# Patient Record
Sex: Male | Born: 1990 | Race: White | Hispanic: No | Marital: Single | Smoking: Never smoker
Health system: Southern US, Community
[De-identification: ages and names within clinical notes are randomized; demographics above are authoritative.]

## PROBLEM LIST (undated history)

## (undated) ENCOUNTER — Encounter: Attending: Family | Primary: Family

## (undated) ENCOUNTER — Telehealth

## (undated) ENCOUNTER — Encounter

## (undated) ENCOUNTER — Ambulatory Visit

## (undated) ENCOUNTER — Encounter: Attending: Infectious Disease | Primary: Infectious Disease

## (undated) ENCOUNTER — Encounter
Attending: Rehabilitative and Restorative Service Providers" | Primary: Rehabilitative and Restorative Service Providers"

## (undated) ENCOUNTER — Encounter
Attending: Student in an Organized Health Care Education/Training Program | Primary: Student in an Organized Health Care Education/Training Program

## (undated) ENCOUNTER — Ambulatory Visit
Payer: BLUE CROSS/BLUE SHIELD | Attending: Student in an Organized Health Care Education/Training Program | Primary: Student in an Organized Health Care Education/Training Program

## (undated) ENCOUNTER — Telehealth: Attending: Family | Primary: Family

## (undated) ENCOUNTER — Ambulatory Visit: Payer: BLUE CROSS/BLUE SHIELD

## (undated) ENCOUNTER — Ambulatory Visit: Payer: PRIVATE HEALTH INSURANCE

## (undated) ENCOUNTER — Encounter: Payer: PRIVATE HEALTH INSURANCE | Attending: Family | Primary: Family

## (undated) ENCOUNTER — Ambulatory Visit: Payer: PRIVATE HEALTH INSURANCE | Attending: Family | Primary: Family

## (undated) ENCOUNTER — Ambulatory Visit: Attending: Pharmacist | Primary: Pharmacist

## (undated) ENCOUNTER — Telehealth
Attending: Student in an Organized Health Care Education/Training Program | Primary: Student in an Organized Health Care Education/Training Program

## (undated) ENCOUNTER — Encounter: Attending: Physical Medicine & Rehabilitation | Primary: Physical Medicine & Rehabilitation

## (undated) ENCOUNTER — Ambulatory Visit: Payer: PRIVATE HEALTH INSURANCE | Attending: Rheumatology | Primary: Rheumatology

## (undated) ENCOUNTER — Telehealth: Attending: Rheumatology | Primary: Rheumatology

## (undated) ENCOUNTER — Encounter: Attending: Ambulatory Care | Primary: Ambulatory Care

## (undated) ENCOUNTER — Encounter: Attending: Pharmacist | Primary: Pharmacist

## (undated) ENCOUNTER — Ambulatory Visit
Payer: PRIVATE HEALTH INSURANCE | Attending: Student in an Organized Health Care Education/Training Program | Primary: Student in an Organized Health Care Education/Training Program

## (undated) ENCOUNTER — Ambulatory Visit: Payer: BLUE CROSS/BLUE SHIELD | Attending: Hematology & Oncology | Primary: Hematology & Oncology

## (undated) ENCOUNTER — Encounter: Attending: Rheumatology | Primary: Rheumatology

## (undated) ENCOUNTER — Encounter: Attending: Hematology & Oncology | Primary: Hematology & Oncology

## (undated) ENCOUNTER — Telehealth: Attending: Ambulatory Care | Primary: Ambulatory Care

## (undated) DIAGNOSIS — M069 Rheumatoid arthritis, unspecified: Secondary | ICD-10-CM

## (undated) DIAGNOSIS — Z9109 Other allergy status, other than to drugs and biological substances: Secondary | ICD-10-CM

## (undated) HISTORY — PX: WISDOM TOOTH EXTRACTION: SHX21

## (undated) HISTORY — PX: MANDIBLE SURGERY: SHX707

## (undated) HISTORY — PX: TYMPANOSTOMY TUBE PLACEMENT: SHX32

## (undated) HISTORY — PX: KNEE SURGERY: SHX244

---
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
## 2008-03-27 ENCOUNTER — Inpatient Hospital Stay (HOSPITAL_COMMUNITY): Admission: AD | Admit: 2008-03-27 | Discharge: 2008-04-03 | Payer: Self-pay | Admitting: Orthopedic Surgery

## 2008-03-28 ENCOUNTER — Ambulatory Visit: Payer: Self-pay | Admitting: Infectious Diseases

## 2008-04-02 ENCOUNTER — Encounter (INDEPENDENT_AMBULATORY_CARE_PROVIDER_SITE_OTHER): Payer: Self-pay | Admitting: Orthopedic Surgery

## 2010-03-01 ENCOUNTER — Encounter: Payer: Self-pay | Admitting: Family Medicine

## 2010-05-26 LAB — BASIC METABOLIC PANEL
CO2: 28 mEq/L (ref 19–32)
CO2: 30 mEq/L (ref 19–32)
CO2: 32 mEq/L (ref 19–32)
Calcium: 9 mg/dL (ref 8.4–10.5)
Calcium: 9.3 mg/dL (ref 8.4–10.5)
Calcium: 9.7 mg/dL (ref 8.4–10.5)
Chloride: 98 mEq/L (ref 96–112)
Creatinine, Ser: 0.82 mg/dL (ref 0.4–1.5)
Creatinine, Ser: 0.82 mg/dL (ref 0.4–1.5)
Creatinine, Ser: 0.94 mg/dL (ref 0.4–1.5)
GFR calc Af Amer: >60 mL/min (ref >60)
GFR calc non Af Amer: >60 mL/min (ref >60)
Glucose, Bld: 118 mg/dL — ABNORMAL HIGH (ref 70–99)
Glucose, Bld: 123 mg/dL — ABNORMAL HIGH (ref 70–99)
Glucose, Bld: 136 mg/dL — ABNORMAL HIGH (ref 70–99)
Sodium: 134 mEq/L — ABNORMAL LOW (ref 135–145)
Sodium: 136 mEq/L (ref 135–145)

## 2010-05-26 LAB — AFB CULTURE WITH SMEAR (NOT AT ARMC): Acid Fast Smear: NONE SEEN

## 2010-05-26 LAB — ANAEROBIC CULTURE

## 2010-05-26 LAB — DIFFERENTIAL
Basophils Absolute: 0.1 10*3/uL (ref 0.0–0.1)
Basophils Relative: 1 % (ref 0–1)
Basophils Relative: 1 % (ref 0–1)
Eosinophils Absolute: 0.1 10*3/uL (ref 0.0–0.7)
Eosinophils Relative: 1 % (ref 0–5)
Lymphocytes Relative: 14 % (ref 12–46)
Lymphocytes Relative: 17 % (ref 12–46)
Lymphs Abs: 1 10*3/uL (ref 0.7–4.0)
Monocytes Absolute: 0.7 10*3/uL (ref 0.1–1.0)
Monocytes Absolute: 0.9 10*3/uL (ref 0.1–1.0)
Monocytes Relative: 10 % (ref 3–12)
Monocytes Relative: 12 % (ref 3–12)
Neutro Abs: 3.7 10*3/uL (ref 1.7–7.7)
Neutro Abs: 5.3 10*3/uL (ref 1.7–7.7)
Neutrophils Relative %: 67 % (ref 43–77)
Neutrophils Relative %: 70 % (ref 43–77)
Neutrophils Relative %: 72 % (ref 43–77)

## 2010-05-26 LAB — VANCOMYCIN, TROUGH: Vancomycin Tr: 11.7 ug/mL (ref 10.0–20.0)

## 2010-05-26 LAB — SYNOVIAL CELL COUNT + DIFF, W/ CRYSTALS
Eosinophils-Synovial: 0 % (ref 0–1)
Lymphocytes-Synovial Fld: 4 % (ref 0–20)
Monocyte-Macrophage-Synovial Fluid: 1 % — ABNORMAL LOW (ref 50–90)
Neutrophil, Synovial: 95 % — ABNORMAL HIGH (ref 0–25)
Other Cells-SYN: 0

## 2010-05-26 LAB — CBC
Hemoglobin: 12.2 g/dL — ABNORMAL LOW (ref 13.0–17.0)
Hemoglobin: 12.3 g/dL — ABNORMAL LOW (ref 13.0–17.0)
Hemoglobin: 13.8 g/dL (ref 13.0–17.0)
MCHC: 35 g/dL (ref 30.0–36.0)
MCHC: 35.2 g/dL (ref 30.0–36.0)
MCHC: 35.2 g/dL (ref 30.0–36.0)
MCV: 83.9 fL (ref 78.0–100.0)
Platelets: 341 10*3/uL (ref 150–400)
RBC: 4.09 MIL/uL — ABNORMAL LOW (ref 4.22–5.81)
RDW: 12.2 % (ref 11.5–15.5)
RDW: 12.4 % (ref 11.5–15.5)
WBC: 7.4 10*3/uL (ref 4.0–10.5)

## 2010-05-26 LAB — FUNGUS CULTURE W SMEAR
Fungal Smear: NONE SEEN
Fungal Smear: NONE SEEN

## 2010-05-26 LAB — ANA: Anti Nuclear Antibody(ANA): NEGATIVE

## 2010-05-26 LAB — BODY FLUID CULTURE
Culture: NO GROWTH
Culture: NO GROWTH

## 2010-05-26 LAB — TISSUE CULTURE: Culture: NO GROWTH

## 2010-05-26 LAB — CYCLIC CITRUL PEPTIDE ANTIBODY, IGG: Cyclic Citrullin Peptide Ab: 0.6 U/mL (ref <7)

## 2010-05-26 LAB — SEDIMENTATION RATE
Sed Rate: 111 mm/hr — ABNORMAL HIGH (ref 0–16)
Sed Rate: 92 mm/hr — ABNORMAL HIGH (ref 0–16)

## 2010-05-26 LAB — GLUCOSE, CAPILLARY

## 2010-06-23 NOTE — Op Note (Signed)
NAME:  Charles Schroeder, Charles Schroeder                  ACCOUNT NO.:  [id number]   MEDICAL RECORD NO.:  [id number]          PATIENT TYPE:  INP   LOCATION:  5025                         FACILITY:  [facility]   PHYSICIAN:  Harvie Junior, M.D.   DATE OF BIRTH:  1990-11-20   DATE OF PROCEDURE:  03/29/2008  DATE OF DISCHARGE:                               OPERATIVE REPORT   PREOPERATIVE DIAGNOSIS:  Severely infected right knee status post  previous arthroscopic incision and drainage in [place], [place].   PROCEDURES:  Open arthrotomy with debridement of multiple contaminated  tissues and thorough lavage and irrigation of the knee joint.   SURGEON:  Harvie Junior, M.D.   ASSISTANT:  Marshia Ly, P.A.   ANESTHESIA:  General.   BRIEF HISTORY:  [name] is a 20 year old male with a long history of  having a fall on a tennis court while he was down at the [facility].  He suffered an abrasion on the anterior aspect of his knee and  unfortunately, over the next several days developed increasing pain and  swelling in the knee.  He was aspirated in the clinic down in [place],  where they got 25,000 white cells.  They were ultimately concerned about  the possibility of an infection.  He was ultimately taken to the  operating room for irrigation and debridement of the knee.  This was  done in arthroscopic fashion.  No drains were placed.  He was given 2  days of IV antibiotics and sent out on orals.  He was brought home  because his family was concerned about him and after about 4-5 days, his  knee began to swell again.  He was seen by Dr. Renae Fickle in our office who  aspirated 120 cc of fluid from the knee and admitted to the hospital for  antibiotics and ID was consulted.  Evaluation at that time showed that  he was not in extreme pain, but he did have a swollen knee but it is not  that warm and is certainly not red.  We awaited results of the  aspiration which shows a 73,000 white cells and we waited for  Infectious  Disease to see the patient because there was still concern about the  possibility of some other diagnosis given that he had not responded well  to the arthroscopic I and D.  We did do an MRI examination which showed  that he had what looked to be some synovitic-type tissue and some  loculated areas and ultimately at this point with the help of the  Infectious Disease, we felt that he needed formal I and D, and he was  brought to the operating room for the procedure.   PROCEDURE:  The patient was brought to the operating room.  After  adequate anesthesia was obtained with general anesthetic, the patient  was placed supine on the operating table.  The right leg was prepped and  draped in usual sterile fashion.  Following this, a small incision was  made over the midline of the knee.  A subcutaneous tissue was dissected  down to the level of the extensor mechanism and a medial aspect of the  quad tendon was divided medial third.  Once this was done, dramatic  amounts of pus was drained from the knee probably a couple of 100 mL of  pus came out of the knee.  At this point, there was a dramatic amount of  granulation and synovitic-type tissue.  We basically went medially and  did a synovectomy.  We went laterally and did a synovectomy.  We did  centrally and did a synovectomy.  We then took the pulsatile lavage  irrigator and instilled 6 liters of fluid through the knee to help wash  out any bacteria or products of inflammation.  Once this was completed,  bug juice was instilled in the knee, 1 liter of bug juice and when this  was completed, this was irrigated out with a handheld irrigation.  At  this point, 2 large-bore Hemovac drains were placed, one medial and one  lateral and then the arthrotomy was closed with  1 Vicryl running and the skin with 3-0 nylon stitches.  Once this was  completed, sterile compressive dressing was applied and the patient was  taken to recovery room  and was noted to be in satisfactory condition.  Estimated blood loss for this procedure was less than 100 mL.      Harvie Junior, M.D.  Electronically Signed     JLG/MEDQ  D:  03/29/2008  T:  03/30/2008  Job:  [id number]

## 2010-06-26 NOTE — Discharge Summary (Signed)
NAME:  Charles Schroeder, Charles Schroeder                  ACCOUNT NO.:  [id number]   MEDICAL RECORD NO.:  [id number]          PATIENT TYPE:  INP   LOCATION:  5025                         FACILITY:  [facility]   PHYSICIAN:  Harvie Junior, M.D.   DATE OF BIRTH:  December 03, 1990   DATE OF ADMISSION:  03/27/2008  DATE OF DISCHARGE:  04/03/2008                               DISCHARGE SUMMARY   ADMITTING DIAGNOSES:  1. Septic arthritis, right knee.  2. Status post right knee arthroscopic irrigation and debridement and      treatment with intravenous antibiotics followed by oral antibiotics      in [place].  3. 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   DISCHARGE DIAGNOSIS:  1. Septic arthritis, right knee.  2. Status post right knee arthroscopic irrigation and debridement and      treatment with intravenous antibiotics followed by oral antibiotics      in [place].  3. 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   PROCEDURES IN THE HOSPITAL:  1. Aspiration right knee, March 28, 2008, Jodi Geralds MD.  2. Incision and drainage arthrotomy and extensive synovectomy, right      knee, March 29, 2008, Jodi Geralds.  3. Insertion of PICC line, April 02, 2008.   CONSULTATIONS IN THE HOSPITAL:  Infectious Disease, Lacretia Leigh. 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  He had a questionable fall on his knee in  the past, a week to 10 days, and ended up with a markedly swollen right  knee in [place] with a history of arthroscopic I&D and treatment with  oral antibiotic, IV antibiotic treatment in the hospital for a presumed  knee joint infection.  The patient presented to the office with  significant effusion, 125 mL of cloudy fluid was aspirated from the  knee, and the patient was admitted the [facility] for a presumed  septic arthritis and was started  on IV antibiotics.  It was felt that he  may need surgical intervention of his knee and will get an Infectious  Disease consult as well while in the hospital.  His plain x-rays were  negative.  He will also get an MRI scan while in the hospital to  evaluate this.  He had low-grade fever.  At the time of the exam, he had  a fever of 98.8.  He is admitted for treatment of his significant  painful swollen right knee with presumed septic arthritis.   PERTINENT LABORATORY STUDIES:  1. MRI of the right knee with and without contrast showed findings      consistent with septic arthritis with fluid collection in the      patellofemoral compartment, appearing loculated with an additional      collection superior, and medial to the main fluid collection, a      small fluid collections were also seen posterior to the femoral  condyles bilaterally.  There was marrow edema enhancement about the      knee, compatible with possible osteomyelitis.  2. Chest x-ray showed no acute findings on March 27, 2008.  3. Portable chest x-ray on April 02, 2008, showed tip of the right-      sided PICC line is in the distal SVC just above the cavoatrial      junction, no acute cardiopulmonary disease.   Hemoglobin on admission was 13.8, hematocrit 39.3, WBC 7.1, indices were  within normal limits.  On March 30, 2008, his hemoglobin was 12.2,  WBC 7.4; on March 31, 2008, WBC 5.5.  Sed rate on admission was 74;  on March 31, 2008, it was 37; on April 01, 2008, it was 50.  Pro  time was 16.1 seconds on admission with an INR of 1.3.  No abnormalities  on his BMETs.  Ferritin level on April 01, 2008, was 330.  His  angiotensin converting enzyme was 31.  C-reactive protein 25.8.  Synovial fluid cell count showed cloudy with 95,000 WBCs and no  crystals.  Cultures of the right knee showed no growth, 3 days, tissue  cultures of the synovium from the right knee showed no growth, 2 days,  and  anaerobic cultures showed no anaerobes isolated.  Fungal cultures  and AFB culture and smear showed no acid-fast bacilli isolated in 6  weeks.  No yeast or fungal elements isolated in 4 weeks.  ANA was  negative.  CCP, which is cyclic citrullinated peptide A, was 0.6, HLA-  B27 was negative, CBG was 134.   HOSPITAL COURSE:  The patient was admitted to the hospital with severe  pain in his right knee with marked swelling.  He had had 125 mL of  cloudy fluid aspirated from the knee in the office.  He was admitted to  hospital and was started on IV vancomycin.  An Infectious Disease  consult was obtained, and Dr. Maurice March evaluated the patient.  He was  treated with IV vancomycin and ciprofloxacin 400 mg, and he was felt to  have a septic arthritis of the right knee.  A 55 mL of purulent fluid  was aspirated from his knee on March 28, 2008, somewhat to give him  relief of his pain.  On March 29, 2008, he was continued on  vancomycin and Cipro.  Cultures showed no growth, but it was too early.  The patient was brought to the operating room where he underwent an I&D  and an arthrotomy and extensive synovectomy of the right knee as he was  noted to have fluid collection in his right knee on MRI scan.  He was  continued on IV antibiotics and continued to be followed by Infectious  Disease.  On March 30, 2008, he had complaints of knee pain.  He was  in significant pain.  He was put on aspirin to decrease the risk of DVT.  His hemoglobin was stable at 12.2 and WBC was 7.4.  Tissue cultures and  fluid cultures were negative.  He had had a urine GC/Chlamydia and HIV  tests in [place] and were negative for the patient.  He continued to  have pain and was slow to mobilize.  His range of motion with his knee  was only 0 degrees to 50 degrees.  He had no calf tenderness, no  redness.  His sed rate was 111.  A drain which was placed at the time of  the surgery was maintained.  He continued to have  knee pain with  bending.  He is afebrile.  Through his hospitalization, he had no  redness.  He had minimal Hemovac drainage.  It is felt to be a possible  rheumatology problem.  The drains were continued until April 02, 2008, at which time, they were pulled because of minimal drainage.  Dr.  Maurice March felt that he should be checked for periarticular collagen vascular  disease, such as JRA and suggested discharged home with vancomycin x3  weeks, a total of 4 weeks, and oral quinolone, Avelox 40 mg daily for 3  weeks.  The patient was, then on postop day #5, afebrile.  He stated he  was ready to go home.  He was taking fluids and voiding without  difficulty.  His right knee motion was improving, he still had swelling  but minimal, more of the redness.  Cultures were negative.  His sed rate  was 92.  ANA was negative.  Rheumatoid factor negative.  We ordered an  HLA-B27 and ACE and CCP per Rheumatology, and I will discharge the  patient home in improved condition.  He will need a home IV vancomycin  x3 weeks and instead of Avelox, per Dr. Maurice March he felt that, Cipro 750 mg  b.i.d. will be appropriate x3 weeks orally.  The patient will follow up  with Dr. Luiz Blare in the office in 1 week.  We may consider referral to  [facility] for rheumatology evaluation there.      Marshia Ly, P.A.      Harvie Junior, M.D.  Electronically Signed    JB/MEDQ  D:  06/12/2008  T:  06/13/2008  Job:  [id number]

## 2010-10-07 ENCOUNTER — Other Ambulatory Visit: Payer: Self-pay | Admitting: Family Medicine

## 2010-10-07 DIAGNOSIS — R6884 Jaw pain: Secondary | ICD-10-CM

## 2010-10-11 ENCOUNTER — Ambulatory Visit
Admission: RE | Admit: 2010-10-11 | Discharge: 2010-10-11 | Disposition: A | Discharge status: Home / Self Care | Payer: BC Managed Care – PPO | Source: Ambulatory Visit | Attending: Family Medicine | Admitting: Family Medicine

## 2010-10-11 DIAGNOSIS — R6884 Jaw pain: Secondary | ICD-10-CM

## 2013-11-02 ENCOUNTER — Emergency Department (HOSPITAL_COMMUNITY): Payer: BC Managed Care – PPO

## 2013-11-02 ENCOUNTER — Encounter (HOSPITAL_COMMUNITY): Payer: Self-pay | Admitting: Emergency Medicine

## 2013-11-02 ENCOUNTER — Emergency Department (HOSPITAL_COMMUNITY)
Admission: EM | Admit: 2013-11-02 | Discharge: 2013-11-02 | Disposition: A | Discharge status: Home / Self Care | Payer: BC Managed Care – PPO | Attending: Emergency Medicine | Admitting: Emergency Medicine

## 2013-11-02 DIAGNOSIS — Z8709 Personal history of other diseases of the respiratory system: Secondary | ICD-10-CM | POA: Insufficient documentation

## 2013-11-02 DIAGNOSIS — I10 Essential (primary) hypertension: Secondary | ICD-10-CM | POA: Diagnosis not present

## 2013-11-02 DIAGNOSIS — M069 Rheumatoid arthritis, unspecified: Secondary | ICD-10-CM | POA: Insufficient documentation

## 2013-11-02 DIAGNOSIS — R21 Rash and other nonspecific skin eruption: Secondary | ICD-10-CM | POA: Insufficient documentation

## 2013-11-02 DIAGNOSIS — R000 Tachycardia, unspecified: Secondary | ICD-10-CM | POA: Insufficient documentation

## 2013-11-02 DIAGNOSIS — T782XXA Anaphylactic shock, unspecified, initial encounter: Secondary | ICD-10-CM

## 2013-11-02 HISTORY — DX: Other allergy status, other than to drugs and biological substances: Z91.09

## 2013-11-02 HISTORY — DX: Rheumatoid arthritis, unspecified: M06.9

## 2013-11-02 LAB — BASIC METABOLIC PANEL
ANION GAP: 17 — AB (ref 5–15)
BUN: 13 mg/dL (ref 6–23)
CHLORIDE: 97 meq/L (ref 96–112)
CO2: 27 mEq/L (ref 19–32)
CREATININE: 1.27 mg/dL (ref 0.50–1.35)
Calcium: 9.2 mg/dL (ref 8.4–10.5)
GFR, EST AFRICAN AMERICAN: 90 mL/min — AB (ref >90)
GFR, EST NON AFRICAN AMERICAN: 77 mL/min — AB (ref >90)
Glucose, Bld: 154 mg/dL — ABNORMAL HIGH (ref 70–99)
Potassium: 3.7 mEq/L (ref 3.7–5.3)
Sodium: 141 mEq/L (ref 137–147)

## 2013-11-02 LAB — CBC
HCT: 51.2 % (ref 39.0–52.0)
Hemoglobin: 18.2 g/dL — ABNORMAL HIGH (ref 13.0–17.0)
MCH: 31.2 pg (ref 26.0–34.0)
MCHC: 35.5 g/dL (ref 30.0–36.0)
MCV: 87.8 fL (ref 78.0–100.0)
PLATELETS: 231 10*3/uL (ref 150–400)
RBC: 5.83 MIL/uL — ABNORMAL HIGH (ref 4.22–5.81)
RDW: 13.9 % (ref 11.5–15.5)
WBC: 17 10*3/uL — AB (ref 4.0–10.5)

## 2013-11-02 MED ORDER — EPINEPHRINE 0.3 MG/0.3ML IJ SOAJ: 0.3000 mg | Freq: Once | INTRAMUSCULAR | Status: AC

## 2013-11-02 MED ORDER — FAMOTIDINE IN NACL 20-0.9 MG/50ML-% IV SOLN
20.0000 mg | Freq: Once | INTRAVENOUS | Status: AC
  Administered 2013-11-02: 20 mg via INTRAVENOUS
  Filled 2013-11-02: qty 50

## 2013-11-02 MED ORDER — FAMOTIDINE 20 MG PO TABS: 20.0000 mg | ORAL_TABLET | Freq: Two times a day (BID) | ORAL | Status: AC

## 2013-11-02 MED ORDER — SODIUM CHLORIDE 0.9 % IV BOLUS (SEPSIS)
1000.0000 mL | Freq: Once | INTRAVENOUS | Status: AC
  Administered 2013-11-02: 1000 mL via INTRAVENOUS

## 2013-11-02 MED ORDER — DIPHENHYDRAMINE HCL 25 MG PO TABS: 25.0000 mg | ORAL_TABLET | Freq: Four times a day (QID) | ORAL | Status: DC | PRN

## 2013-11-02 MED ORDER — PREDNISONE 20 MG PO TABS: ORAL_TABLET | ORAL | Status: DC

## 2013-11-02 MED ORDER — DIPHENHYDRAMINE HCL 50 MG/ML IJ SOLN
50.0000 mg | Freq: Once | INTRAMUSCULAR | Status: DC
  Filled 2013-11-02: qty 1

## 2013-11-02 MED ORDER — DEXAMETHASONE SODIUM PHOSPHATE 10 MG/ML IJ SOLN
10.0000 mg | Freq: Once | INTRAMUSCULAR | Status: AC
  Administered 2013-11-02: 10 mg via INTRAVENOUS
  Filled 2013-11-02: qty 1

## 2013-11-02 NOTE — ED Notes (Signed)
Dr Zavitz at bedside  

## 2013-11-02 NOTE — ED Notes (Signed)
Patient presents to ED via [facility] from home. Patient started noticing that he was "itching" yesterday morning and last night at approx 1800 he noticed a rash that progressively worsened- including upper body and eyes. Patient is unsure what he has had a reaction to. Airway intact and respirations even and unlabored. EMS administered 50 mg benadryl and 1 epi IM. Pt is A&Ox4. No acute distress at this time. VSS.

## 2013-11-02 NOTE — ED Provider Notes (Addendum)
CSN: [id number]     Arrival date & time 11/02/13  0431 History   First MD Initiated Contact with Patient 11/02/13 [phone]     Chief Complaint  Patient presents with  . Allergic Reaction     (Consider location/radiation/quality/duration/timing/severity/associated sxs/prior Treatment) HPI Comments: 23 year old male with rheumatoid arthritis on methotrexate and Humira presents with diffuse itching to skin and worsening rash and allergy symptoms. Patient has had gradually worsening macular rash for the past 24 hours. No new medicines, no new soaps or new foods. Patient unsure the cause. Patient recently had some insect bites and was on the prednisone taper that he completed. Patient developed eye swelling this evening and then felt generally weak and nauseous prior to arrival. Patient collapsed from general weakness and lightheadedness. Patient has allergy to grass sulfa and dogs for which she's not been exposed to recently that he is aware of. Patient received epi IM by EMS and has mild improvement.  Patient is a 23 y.o. male presenting with allergic reaction. The history is provided by the patient.  Allergic Reaction Presenting symptoms: rash     Past Medical History  Diagnosis Date  . Environmental allergies   . Rheumatoid arthritis    Past Surgical History  Procedure Laterality Date  . Knee surgery    . Tympanostomy tube placement    . Mandible surgery    . Wisdom tooth extraction     No family history on file. History  Substance Use Topics  . Smoking status: Never Smoker   . Smokeless tobacco: Never Used  . Alcohol Use: Yes     Comment: socially    Review of Systems  Constitutional: Positive for appetite change. Negative for fever and chills.  HENT: Negative for congestion.   Eyes: Negative for visual disturbance.  Respiratory: Positive for shortness of breath.   Cardiovascular: Negative for chest pain.  Gastrointestinal: Negative for vomiting and abdominal pain.   Genitourinary: Negative for dysuria and flank pain.  Musculoskeletal: Negative for back pain, neck pain and neck stiffness.  Skin: Positive for rash.  Neurological: Positive for light-headedness. Negative for headaches.      Allergies  Dog epithelium; Dust mite extract; and Sulfa antibiotics  Home Medications   Prior to Admission medications   Medication Sig Start Date End Date Taking? Authorizing Provider  folic acid (FOLVITE) 1 MG tablet  10/28/13   Historical Provider, MD  HUMIRA PEN 40 MG/0.8ML PNKT  09/14/13   Historical Provider, MD  methotrexate (RHEUMATREX) 2.5 MG tablet  10/01/13   Historical Provider, MD   BP 119/64  Pulse 99  Temp(Src) 98.4 F (36.9 C) (Oral)  Resp 16  Ht 6\' 2"  (1.88 m)  Wt 250 lb (113.399 kg)  BMI 32.08 kg/m2  SpO2 99% Physical Exam  Nursing note and vitals reviewed. Constitutional: He is oriented to person, place, and time. He appears well-developed and well-nourished.  HENT:  Head: Normocephalic and atraumatic.  Mild dry mucous membranes  Eyes: Conjunctivae are normal. Right eye exhibits no discharge. Left eye exhibits no discharge.  Neck: Normal range of motion. Neck supple. No tracheal deviation present.  Cardiovascular: Regular rhythm.  Tachycardia present.   Pulmonary/Chest: Effort normal and breath sounds normal.  Abdominal: Soft. He exhibits no distension. There is no tenderness. There is no guarding.  Musculoskeletal: He exhibits no edema.  Neurological: He is alert and oriented to person, place, and time.  Skin: Skin is warm. Rash (mild hives) noted. There is erythema (no petechia or purpura, neck  supple no meningismus).  Patient has mild swelling periorbital bilateral and face. No stridor. Patient has macular rash axilla and proximal arms bilateral flanks.  Psychiatric: He has a normal mood and affect.    ED Course  Procedures (including critical care time) Labs Review Labs Reviewed  CBC  BASIC METABOLIC PANEL    Imaging  Review No results found.   EKG Interpretation   Date/Time:  Friday November 02 2013 04:39:41 EDT Ventricular Rate:  100 PR Interval:  163 QRS Duration: 83 QT Interval:  299 QTC Calculation: 386 R Axis:   -3 Text Interpretation:  Sinus tachycardia Confirmed by Xiara Knisley  MD, Lashika Erker  ([id number]) on 11/02/2013 4:46:23 AM      MDM   Final diagnoses:  Anaphylaxis, initial encounter  Skin rash   Patient presented clinically as anaphylaxis as patient had respiratory, skin and general weakness/near syncope symptoms. Mild tachycardia here with normal blood pressure. IV fluids, steroids, Benadryl and plan for close observation for 3-4 hours.  Patient has gradually improved on recheck in ER, mild tachycardia. Second liter bolus given. Plan for observation for 4 hours and will sign out with plan for final recheck and reassessment. Discussed outpatient followup with the patient and family.  Blood work showed leukocytosis likely from stress response/dehydration however with mild shortness of breath chest x-ray data to look for pneumonia. This has been improving in ER chest x-ray pending,  Results and differential diagnosis were discussed with the patient/parent/guardian. Close follow up outpatient was discussed, comfortable with the plan.   Medications  sodium chloride 0.9 % bolus 1,000 mL (0 mLs Intravenous Stopped 11/02/13 0633)  dexamethasone (DECADRON) injection 10 mg (10 mg Intravenous Given 11/02/13 0518)  famotidine (PEPCID) IVPB 20 mg (0 mg Intravenous Stopped 11/02/13 0553)  sodium chloride 0.9 % bolus 1,000 mL (1,000 mLs Intravenous New Bag/Given 11/02/13 0635)    Filed Vitals:   11/02/13 0615 11/02/13 0630 11/02/13 0645 11/02/13 0730  BP: 101/47 92/52 95/55  110/61  Pulse: 106 115 108 108  Temp:      TempSrc:      Resp: 27 24 26 24   Height:      Weight:      SpO2: 96% 97% 99% 98%    Final diagnoses:  Anaphylaxis, initial encounter  Skin rash       ,  MD 11/02/13 11/04/13  [id number], MD 11/02/13 11/04/13  [id number], MD 11/02/13 [phone]

## 2013-11-02 NOTE — ED Provider Notes (Signed)
8:35 AM, patient reevaluated, no difficulty breathing, no wheezing, no shortness of breath, pulse 102 beats per minute, blood pressure 120 systolic, no itching. Still has a mild splotchy rash, no recurrence of hypotension or other signs of severe anaphylaxis, patient was given steroids, antihistamines and epinephrine during his treatment course. I have discussed with the patient and his family member at length the need for followup allergy testing with family doctor or allergist, EpiPen usage as an outpatient and ongoing treatment over the next several days for this acute illness to which he has agreed and states he feels comfortable with the plan. At this time the patient's vital signs and clinical picture appears stable for discharge.  Meds given in ED:  Medications  sodium chloride 0.9 % bolus 1,000 mL (0 mLs Intravenous Stopped 11/02/13 0633)  dexamethasone (DECADRON) injection 10 mg (10 mg Intravenous Given 11/02/13 0518)  famotidine (PEPCID) IVPB 20 mg (0 mg Intravenous Stopped 11/02/13 0553)  sodium chloride 0.9 % bolus 1,000 mL (1,000 mLs Intravenous New Bag/Given 11/02/13 0635)    New Prescriptions   EPINEPHRINE 0.3 MG/0.3 ML IJ SOAJ INJECTION    Inject 0.3 mLs (0.3 mg total) into the muscle once.   PREDNISONE (DELTASONE) 20 MG TABLET    2 tabs po daily x 4 days      Vida Roller, MD 11/02/13 [phone]

## 2013-11-02 NOTE — Discharge Instructions (Signed)
If you were given medicines take as directed.  If you are on coumadin or contraceptives realize their levels and effectiveness is altered by many different medicines.  If you have any reaction (rash, tongues swelling, other) to the medicines stop taking and see a physician.   Please follow up as directed and return to the ER or see a physician for new or worsening symptoms.  Thank you. Filed Vitals:   11/02/13 0449 11/02/13 0453 11/02/13 0455 11/02/13 0500  BP:   142/56 119/64  Pulse:   106 99  Temp:      TempSrc:      Resp:   16 16  Height:  $Remove'6\' 2"'[id number]$  (1.88 m)    Weight:  250 lb (113.399 kg)    SpO2: 99%  100% 99%    Anaphylactic Reaction An anaphylactic reaction is a sudden, severe allergic reaction. It affects the whole body. It can be life threatening. You may need to stay in the hospital.  [place] a medical bracelet or necklace that lists your allergy.  Carry your allergy kit or medicine shot to treat severe allergic reactions with you. These can save your life.  Do not drive until medicine from your shot has worn off, unless your doctor says it is okay.  If you have hives or a rash:  Take medicine as told by your doctor.  You may take over-the-counter antihistamine medicine.  Place cold cloths on your skin. Take baths in cool water. Avoid hot baths and hot showers. GET HELP RIGHT AWAY IF:   Your mouth is puffy (swollen), or you have trouble breathing.  You start making whistling sounds when you breathe (wheezing).  You have a tight feeling in your chest or throat.  You have a rash, hives, puffiness, or itching on your body.  You throw up (vomit) or have watery poop (diarrhea).  You feel dizzy or pass out (faint).  You think you are having an allergic reaction.  You have new symptoms. This is an emergency. Use your medicine shot or allergy kit as told. Call your local emergency services (911 in [place] Even if you feel better after the shot, you need to go to  the hospital emergency department. MAKE SURE YOU:   Understand these instructions.  Will watch your condition.  Will get help right away if you are not doing well or get worse. Document Released: 07/14/2007 Document Revised: 07/27/2011 Document Reviewed: 04/28/2011 [facility] Patient Information 2015 [place], [place]. This information is not intended to replace advice given to you by your health care provider. Make sure you discuss any questions you have with your health care provider.

## 2013-11-07 ENCOUNTER — Emergency Department (HOSPITAL_COMMUNITY)
Admission: EM | Admit: 2013-11-07 | Discharge: 2013-11-07 | Disposition: A | Discharge status: Home / Self Care | Payer: BC Managed Care – PPO | Attending: Emergency Medicine | Admitting: Emergency Medicine

## 2013-11-07 ENCOUNTER — Encounter (HOSPITAL_COMMUNITY): Payer: Self-pay | Admitting: Emergency Medicine

## 2013-11-07 DIAGNOSIS — L509 Urticaria, unspecified: Secondary | ICD-10-CM | POA: Diagnosis not present

## 2013-11-07 DIAGNOSIS — R21 Rash and other nonspecific skin eruption: Secondary | ICD-10-CM | POA: Diagnosis present

## 2013-11-07 DIAGNOSIS — Z79899 Other long term (current) drug therapy: Secondary | ICD-10-CM | POA: Diagnosis not present

## 2013-11-07 DIAGNOSIS — Z8739 Personal history of other diseases of the musculoskeletal system and connective tissue: Secondary | ICD-10-CM | POA: Diagnosis not present

## 2013-11-07 DIAGNOSIS — F411 Generalized anxiety disorder: Secondary | ICD-10-CM | POA: Diagnosis not present

## 2013-11-07 DIAGNOSIS — IMO0002 Reserved for concepts with insufficient information to code with codable children: Secondary | ICD-10-CM | POA: Insufficient documentation

## 2013-11-07 MED ORDER — FAMOTIDINE IN NACL 20-0.9 MG/50ML-% IV SOLN
20.0000 mg | Freq: Once | INTRAVENOUS | Status: AC
  Administered 2013-11-07: 20 mg via INTRAVENOUS
  Filled 2013-11-07: qty 50

## 2013-11-07 MED ORDER — PREDNISONE 20 MG PO TABS: ORAL_TABLET | ORAL | Status: DC

## 2013-11-07 MED ORDER — SODIUM CHLORIDE 0.9 % IV BOLUS (SEPSIS)
1000.0000 mL | Freq: Once | INTRAVENOUS | Status: AC
  Administered 2013-11-07: 1000 mL via INTRAVENOUS

## 2013-11-07 MED ORDER — METHYLPREDNISOLONE SODIUM SUCC 125 MG IJ SOLR
125.0000 mg | Freq: Once | INTRAMUSCULAR | Status: AC
  Administered 2013-11-07: 125 mg via INTRAVENOUS
  Filled 2013-11-07: qty 2

## 2013-11-07 NOTE — ED Notes (Signed)
Pt returned to ED with hives over body, arms, states throat feels scratchy also. No resp distress --

## 2013-11-07 NOTE — ED Provider Notes (Signed)
CSN: [id number]     Arrival date & time 11/07/13  0746 History   First MD Initiated Contact with Patient 11/07/13 [phone]     Chief Complaint  Patient presents with  . allergic rxn      (Consider location/radiation/quality/duration/timing/severity/associated sxs/prior Treatment) The history is provided by the patient.  Huber Mathers is a 23 y.o. male hx of environmental allergies here with allergic reaction. He had allergic reaction after coming back from [place] several weeks ago. Finished a course of steroids and came back with allergic reaction 5 days ago. Now finished second course of steroids 2 days ago and rash worsened this morning. Has some throat tightness and shortness of breath but no throat closing. No fevers. Has epi pen at home but didn't use it. Denies any new foods, detergents, shampoos. Took benadryl 50 mg prior to arrival.    Past Medical History  Diagnosis Date  . Environmental allergies   . Rheumatoid arthritis    Past Surgical History  Procedure Laterality Date  . Knee surgery    . Tympanostomy tube placement    . Mandible surgery    . Wisdom tooth extraction     No family history on file. History  Substance Use Topics  . Smoking status: Never Smoker   . Smokeless tobacco: Never Used  . Alcohol Use: Yes     Comment: socially    Review of Systems  Skin: Positive for rash.  All other systems reviewed and are negative.     Allergies  Bactrim; Dog epithelium; Dust mite extract; Pollen extract; and Sulfa antibiotics  Home Medications   Prior to Admission medications   Medication Sig Start Date End Date Taking? Authorizing Provider  diphenhydrAMINE (BENADRYL) 25 MG tablet Take 1 tablet (25 mg total) by mouth every 6 (six) hours as needed for itching (Rash). 11/02/13  Yes Vida Roller, MD  EPINEPHrine 0.3 mg/0.3 mL IJ SOAJ injection Inject 0.3 mLs (0.3 mg total) into the muscle once. 11/02/13  Yes Enid Skeens, MD  famotidine (PEPCID) 20 MG tablet Take  1 tablet (20 mg total) by mouth 2 (two) times daily. 11/02/13  Yes Vida Roller, MD  folic acid (FOLVITE) 1 MG tablet Take 3 mg by mouth daily.  10/28/13  Yes Historical Provider, MD  HUMIRA PEN 40 MG/0.8ML PNKT Inject 0.4 mLs into the skin every 14 (fourteen) days.  09/14/13  Yes Historical Provider, MD  methotrexate (RHEUMATREX) 5 MG tablet Take 25 mg by mouth once a week. Caution: Chemotherapy. Protect from light.   Yes Historical Provider, MD  predniSONE (DELTASONE) 20 MG tablet Take 40 mg by mouth daily. For 4 days. Starting 11/02/2013    Historical Provider, MD   BP 121/77  Pulse 78  Temp(Src) 98.2 F (36.8 C) (Oral)  Resp 18  Wt 250 lb (113.399 kg)  SpO2 99% Physical Exam  Nursing note and vitals reviewed. Constitutional: He is oriented to person, place, and time. He appears well-nourished.  Anxious   HENT:  Head: Normocephalic.  Mouth/Throat: Oropharynx is clear and moist.  OP clear   Eyes: Conjunctivae are normal. Pupils are equal, round, and reactive to light.  Neck: Normal range of motion. Neck supple.  No stridor   Cardiovascular: Normal rate, regular rhythm and normal heart sounds.   Pulmonary/Chest: Effort normal and breath sounds normal. No respiratory distress. He has no wheezes. He has no rales.  Abdominal: Soft. Bowel sounds are normal. He exhibits no distension. There is no tenderness. There  is no rebound and no guarding.  Musculoskeletal: Normal range of motion. He exhibits no edema and no tenderness.  Neurological: He is alert and oriented to person, place, and time. No cranial nerve deficit. Coordination normal.  Skin:  Urticaria mainly in the upper back, torso. No evidence of cellulitis.   Psychiatric: He has a normal mood and affect. His behavior is normal. Judgment and thought content normal.    ED Course  Procedures (including critical care time) Labs Review Labs Reviewed - No data to display  Imaging Review No results found.   EKG  Interpretation   Date/Time:  Wednesday November 07 2013 08:00:31 EDT Ventricular Rate:  76 PR Interval:  171 QRS Duration: 68 QT Interval:  349 QTC Calculation: 392 R Axis:   29 Text Interpretation:  Sinus rhythm Probable left atrial enlargement Low  voltage, precordial leads No significant change since last tracing  Confirmed by YAO  MD, DAVID ([id number]) on 11/07/2013 8:08:19 AM      MDM   Final diagnoses:  None    Rahul Malinak is a 23 y.o. male here with allergic reaction. No need for epi right now, protecting airway. Will give steroids. Will observe.   10:29 AM Rash improved with steroids. Will d/c home with longer course of steroids with taper. Has allergy f/u.    Richardean Canal, MD 11/07/13 1030

## 2013-11-07 NOTE — Discharge Instructions (Signed)
Take prednisone as prescribed.   Take benadryl for itchiness and rash.  Continue take pepcid as needed.   Follow up with your allergist and PMD.   Return to ER if you have worse itchiness, trouble breathing, throat closing.   Carry epi pen with you at all times.

## 2016-08-18 ENCOUNTER — Ambulatory Visit
Admission: RE | Admit: 2016-08-18 | Discharge: 2016-08-18 | Disposition: A | Discharge status: Home / Self Care | Payer: BC Managed Care – PPO

## 2016-08-18 DIAGNOSIS — M1990 Unspecified osteoarthritis, unspecified site: Principal | ICD-10-CM

## 2016-08-20 ENCOUNTER — Ambulatory Visit: Admission: RE | Admit: 2016-08-20 | Discharge: 2016-08-20 | Disposition: A | Discharge status: Home / Self Care

## 2016-08-20 DIAGNOSIS — M1990 Unspecified osteoarthritis, unspecified site: Principal | ICD-10-CM

## 2016-09-06 ENCOUNTER — Ambulatory Visit
Admission: RE | Admit: 2016-09-06 | Discharge: 2016-09-06 | Disposition: A | Discharge status: Home / Self Care | Payer: BC Managed Care – PPO | Attending: Rheumatology | Admitting: Rheumatology

## 2016-09-06 DIAGNOSIS — M1380 Other specified arthritis, unspecified site: Principal | ICD-10-CM

## 2016-09-06 MED ORDER — METHOTREXATE SODIUM 2.5 MG TABLET
ORAL_TABLET | ORAL | 3 refills | 0 days | Status: CP
Start: 2016-09-06 — End: 2017-03-16

## 2016-09-23 ENCOUNTER — Ambulatory Visit
Admission: RE | Admit: 2016-09-23 | Discharge: 2016-09-23 | Disposition: A | Discharge status: Home / Self Care | Payer: BC Managed Care – PPO

## 2016-09-23 DIAGNOSIS — S30861A Insect bite (nonvenomous) of abdominal wall, initial encounter: Principal | ICD-10-CM

## 2016-09-23 DIAGNOSIS — W57XXXA Bitten or stung by nonvenomous insect and other nonvenomous arthropods, initial encounter: Secondary | ICD-10-CM

## 2016-10-07 ENCOUNTER — Ambulatory Visit
Admission: RE | Admit: 2016-10-07 | Discharge: 2016-10-07 | Disposition: A | Discharge status: Home / Self Care | Payer: BC Managed Care – PPO

## 2016-10-07 DIAGNOSIS — W57XXXA Bitten or stung by nonvenomous insect and other nonvenomous arthropods, initial encounter: Secondary | ICD-10-CM

## 2016-10-07 DIAGNOSIS — S30861A Insect bite (nonvenomous) of abdominal wall, initial encounter: Principal | ICD-10-CM

## 2017-01-03 ENCOUNTER — Ambulatory Visit
Admission: RE | Admit: 2017-01-03 | Discharge: 2017-01-03 | Disposition: A | Discharge status: Home / Self Care | Payer: BC Managed Care – PPO | Attending: Rheumatology | Admitting: Rheumatology

## 2017-01-03 DIAGNOSIS — M1990 Unspecified osteoarthritis, unspecified site: Principal | ICD-10-CM

## 2017-03-16 MED ORDER — METHOTREXATE SODIUM 2.5 MG TABLET
ORAL_TABLET | ORAL | 0 refills | 0 days | Status: CP
Start: 2017-03-16 — End: 2017-04-05

## 2017-04-05 ENCOUNTER — Encounter: Admit: 2017-04-05 | Discharge: 2017-04-06 | Payer: PRIVATE HEALTH INSURANCE

## 2017-04-05 DIAGNOSIS — M1990 Unspecified osteoarthritis, unspecified site: Principal | ICD-10-CM

## 2017-04-05 MED ORDER — METHOTREXATE SODIUM 2.5 MG TABLET
ORAL_TABLET | ORAL | 3 refills | 0 days | Status: CP
Start: 2017-04-05 — End: 2017-09-21

## 2017-05-20 ENCOUNTER — Encounter: Admit: 2017-05-20 | Discharge: 2017-05-21 | Payer: PRIVATE HEALTH INSURANCE

## 2017-05-20 DIAGNOSIS — J320 Chronic maxillary sinusitis: Principal | ICD-10-CM

## 2017-05-20 MED ORDER — AMOXICILLIN 875 MG-POTASSIUM CLAVULANATE 125 MG TABLET
ORAL_TABLET | Freq: Two times a day (BID) | ORAL | 0 refills | 0.00000 days | Status: CP
Start: 2017-05-20 — End: 2017-06-06

## 2017-05-25 ENCOUNTER — Encounter: Admit: 2017-05-25 | Discharge: 2017-05-26 | Payer: PRIVATE HEALTH INSURANCE | Attending: Family | Primary: Family

## 2017-05-25 DIAGNOSIS — J0100 Acute maxillary sinusitis, unspecified: Principal | ICD-10-CM

## 2017-05-25 MED ORDER — LEVOFLOXACIN 500 MG TABLET
ORAL_TABLET | Freq: Every day | ORAL | 0 refills | 0.00000 days | Status: CP
Start: 2017-05-25 — End: 2017-06-06

## 2017-05-25 MED ORDER — ALBUTEROL SULFATE HFA 90 MCG/ACTUATION AEROSOL INHALER
RESPIRATORY_TRACT | 0 refills | 0.00000 days | Status: CP | PRN
Start: 2017-05-25 — End: 2017-10-06

## 2017-06-06 ENCOUNTER — Encounter
Admit: 2017-06-06 | Discharge: 2017-06-07 | Payer: PRIVATE HEALTH INSURANCE | Attending: Rheumatology | Primary: Rheumatology

## 2017-06-06 DIAGNOSIS — M1380 Other specified arthritis, unspecified site: Principal | ICD-10-CM

## 2017-06-06 MED ORDER — ADALIMUMAB 40 MG/0.8 ML SUBCUTANEOUS PEN KIT
SUBCUTANEOUS | 3 refills | 0.00000 days | Status: CP
Start: 2017-06-06 — End: 2018-06-01

## 2017-09-20 ENCOUNTER — Encounter: Admit: 2017-09-20 | Discharge: 2017-09-21 | Payer: PRIVATE HEALTH INSURANCE | Attending: Family | Primary: Family

## 2017-09-20 DIAGNOSIS — Z0000 Encounter for general adult medical examination without abnormal findings: Principal | ICD-10-CM

## 2017-09-20 DIAGNOSIS — M1380 Other specified arthritis, unspecified site: Secondary | ICD-10-CM

## 2017-09-20 MED ORDER — FLUOXETINE 20 MG TABLET
ORAL_TABLET | Freq: Every day | ORAL | 0 refills | 0 days | Status: CP
Start: 2017-09-20 — End: 2018-04-21

## 2017-09-21 MED ORDER — METHOTREXATE SODIUM 2.5 MG TABLET
ORAL_TABLET | ORAL | 3 refills | 0.00000 days | Status: CP
Start: 2017-09-21 — End: 2018-03-08

## 2017-10-11 ENCOUNTER — Encounter: Admit: 2017-10-11 | Discharge: 2017-10-12 | Payer: PRIVATE HEALTH INSURANCE | Attending: Family | Primary: Family

## 2017-10-11 DIAGNOSIS — K219 Gastro-esophageal reflux disease without esophagitis: Principal | ICD-10-CM

## 2017-10-11 DIAGNOSIS — R112 Nausea with vomiting, unspecified: Secondary | ICD-10-CM

## 2017-10-11 MED ORDER — RANITIDINE 150 MG CAPSULE
ORAL_CAPSULE | Freq: Two times a day (BID) | ORAL | 1 refills | 0 days | Status: CP
Start: 2017-10-11 — End: 2017-12-05

## 2017-10-11 MED ORDER — ALBUTEROL SULFATE HFA 90 MCG/ACTUATION AEROSOL INHALER
RESPIRATORY_TRACT | 5 refills | 0.00000 days | Status: CP | PRN
Start: 2017-10-11 — End: 2018-09-04

## 2017-11-28 ENCOUNTER — Encounter: Admit: 2017-11-28 | Discharge: 2017-11-29 | Payer: PRIVATE HEALTH INSURANCE | Attending: Family | Primary: Family

## 2017-11-28 DIAGNOSIS — S6991XA Unspecified injury of right wrist, hand and finger(s), initial encounter: Principal | ICD-10-CM

## 2017-11-28 DIAGNOSIS — R0683 Snoring: Secondary | ICD-10-CM

## 2017-11-28 DIAGNOSIS — R5383 Other fatigue: Secondary | ICD-10-CM

## 2017-11-28 DIAGNOSIS — I10 Essential (primary) hypertension: Secondary | ICD-10-CM

## 2017-12-05 ENCOUNTER — Ambulatory Visit: Admit: 2017-12-05 | Discharge: 2017-12-06 | Payer: PRIVATE HEALTH INSURANCE

## 2017-12-05 DIAGNOSIS — M1990 Unspecified osteoarthritis, unspecified site: Secondary | ICD-10-CM

## 2017-12-05 DIAGNOSIS — M1380 Other specified arthritis, unspecified site: Principal | ICD-10-CM

## 2017-12-07 MED ORDER — RANITIDINE 150 MG CAPSULE
ORAL_CAPSULE | Freq: Two times a day (BID) | ORAL | 1 refills | 0 days | Status: CP
Start: 2017-12-07 — End: 2018-02-17

## 2018-02-17 ENCOUNTER — Encounter: Admit: 2018-02-17 | Discharge: 2018-02-18 | Payer: PRIVATE HEALTH INSURANCE | Attending: Family | Primary: Family

## 2018-02-17 DIAGNOSIS — R0989 Other specified symptoms and signs involving the circulatory and respiratory systems: Principal | ICD-10-CM

## 2018-02-17 MED ORDER — OMEPRAZOLE 20 MG CAPSULE,DELAYED RELEASE
ORAL_CAPSULE | Freq: Every day | ORAL | 1 refills | 0 days | Status: CP
Start: 2018-02-17 — End: 2018-04-21

## 2018-03-08 ENCOUNTER — Encounter: Admit: 2018-03-08 | Discharge: 2018-03-09 | Payer: PRIVATE HEALTH INSURANCE

## 2018-03-08 DIAGNOSIS — M1380 Other specified arthritis, unspecified site: Principal | ICD-10-CM

## 2018-03-08 MED ORDER — METHOTREXATE SODIUM 2.5 MG TABLET
ORAL_TABLET | ORAL | 3 refills | 0 days | Status: CP
Start: 2018-03-08 — End: 2018-07-10

## 2018-04-20 ENCOUNTER — Encounter
Admit: 2018-04-20 | Discharge: 2018-04-20 | Disposition: A | Discharge status: Home / Self Care | Payer: PRIVATE HEALTH INSURANCE

## 2018-04-20 DIAGNOSIS — R200 Anesthesia of skin: Principal | ICD-10-CM

## 2018-04-20 DIAGNOSIS — M1990 Unspecified osteoarthritis, unspecified site: Principal | ICD-10-CM

## 2018-04-20 DIAGNOSIS — R202 Paresthesia of skin: Principal | ICD-10-CM

## 2018-04-20 DIAGNOSIS — J45909 Unspecified asthma, uncomplicated: Principal | ICD-10-CM

## 2018-04-20 DIAGNOSIS — Z79899 Other long term (current) drug therapy: Principal | ICD-10-CM

## 2018-04-20 DIAGNOSIS — E669 Obesity, unspecified: Principal | ICD-10-CM

## 2018-04-20 DIAGNOSIS — M069 Rheumatoid arthritis, unspecified: Principal | ICD-10-CM

## 2018-04-20 DIAGNOSIS — Z882 Allergy status to sulfonamides status: Principal | ICD-10-CM

## 2018-04-21 ENCOUNTER — Ambulatory Visit: Admit: 2018-04-21 | Discharge: 2018-04-22 | Payer: PRIVATE HEALTH INSURANCE

## 2018-04-21 DIAGNOSIS — J45909 Unspecified asthma, uncomplicated: Principal | ICD-10-CM

## 2018-04-21 DIAGNOSIS — M1990 Unspecified osteoarthritis, unspecified site: Principal | ICD-10-CM

## 2018-04-21 DIAGNOSIS — R200 Anesthesia of skin: Principal | ICD-10-CM

## 2018-04-21 DIAGNOSIS — E669 Obesity, unspecified: Principal | ICD-10-CM

## 2018-04-24 ENCOUNTER — Encounter
Admit: 2018-04-24 | Discharge: 2018-04-24 | Disposition: A | Discharge status: Home / Self Care | Payer: PRIVATE HEALTH INSURANCE | Attending: Emergency Medicine

## 2018-04-24 DIAGNOSIS — Z96659 Presence of unspecified artificial knee joint: Principal | ICD-10-CM

## 2018-04-24 DIAGNOSIS — J45909 Unspecified asthma, uncomplicated: Principal | ICD-10-CM

## 2018-04-24 DIAGNOSIS — Z882 Allergy status to sulfonamides status: Principal | ICD-10-CM

## 2018-04-24 DIAGNOSIS — R202 Paresthesia of skin: Principal | ICD-10-CM

## 2018-04-24 DIAGNOSIS — Z79899 Other long term (current) drug therapy: Principal | ICD-10-CM

## 2018-04-24 DIAGNOSIS — M069 Rheumatoid arthritis, unspecified: Principal | ICD-10-CM

## 2018-04-24 DIAGNOSIS — E669 Obesity, unspecified: Principal | ICD-10-CM

## 2018-04-24 DIAGNOSIS — M1990 Unspecified osteoarthritis, unspecified site: Principal | ICD-10-CM

## 2018-04-24 DIAGNOSIS — R200 Anesthesia of skin: Principal | ICD-10-CM

## 2018-04-24 DIAGNOSIS — Z7951 Long term (current) use of inhaled steroids: Principal | ICD-10-CM

## 2018-04-27 ENCOUNTER — Ambulatory Visit
Admit: 2018-04-27 | Discharge: 2018-04-27 | Disposition: A | Discharge status: Home / Self Care | Payer: PRIVATE HEALTH INSURANCE | Attending: Emergency Medicine

## 2018-04-27 ENCOUNTER — Encounter: Admit: 2018-04-27 | Discharge: 2018-04-28 | Payer: PRIVATE HEALTH INSURANCE

## 2018-04-27 ENCOUNTER — Encounter: Admit: 2018-04-27 | Discharge: 2018-04-28 | Payer: PRIVATE HEALTH INSURANCE | Attending: Family | Primary: Family

## 2018-04-27 DIAGNOSIS — R200 Anesthesia of skin: Principal | ICD-10-CM

## 2018-04-27 DIAGNOSIS — J45909 Unspecified asthma, uncomplicated: Principal | ICD-10-CM

## 2018-04-27 DIAGNOSIS — I10 Essential (primary) hypertension: Principal | ICD-10-CM

## 2018-04-27 DIAGNOSIS — R201 Hypoesthesia of skin: Principal | ICD-10-CM

## 2018-04-27 DIAGNOSIS — E669 Obesity, unspecified: Principal | ICD-10-CM

## 2018-04-27 DIAGNOSIS — M1990 Unspecified osteoarthritis, unspecified site: Principal | ICD-10-CM

## 2018-04-27 DIAGNOSIS — R930 Abnormal findings on diagnostic imaging of skull and head, not elsewhere classified: Principal | ICD-10-CM

## 2018-04-27 DIAGNOSIS — Z79899 Other long term (current) drug therapy: Principal | ICD-10-CM

## 2018-04-27 DIAGNOSIS — R208 Other disturbances of skin sensation: Principal | ICD-10-CM

## 2018-04-27 DIAGNOSIS — Z882 Allergy status to sulfonamides status: Principal | ICD-10-CM

## 2018-04-27 DIAGNOSIS — Q283 Other malformations of cerebral vessels: Principal | ICD-10-CM

## 2018-04-27 DIAGNOSIS — G5620 Lesion of ulnar nerve, unspecified upper limb: Principal | ICD-10-CM

## 2018-04-27 DIAGNOSIS — Z96659 Presence of unspecified artificial knee joint: Principal | ICD-10-CM

## 2018-04-27 DIAGNOSIS — M069 Rheumatoid arthritis, unspecified: Principal | ICD-10-CM

## 2018-04-27 MED ORDER — GABAPENTIN 300 MG CAPSULE
ORAL_CAPSULE | Freq: Every evening | ORAL | 2 refills | 0 days | Status: CP
Start: 2018-04-27 — End: 2018-06-14

## 2018-06-01 ENCOUNTER — Institutional Professional Consult (permissible substitution): Admit: 2018-06-01 | Discharge: 2018-06-02 | Payer: PRIVATE HEALTH INSURANCE

## 2018-06-01 DIAGNOSIS — Z5181 Encounter for therapeutic drug level monitoring: Secondary | ICD-10-CM

## 2018-06-01 DIAGNOSIS — M1990 Unspecified osteoarthritis, unspecified site: Principal | ICD-10-CM

## 2018-06-01 DIAGNOSIS — Z79899 Other long term (current) drug therapy: Secondary | ICD-10-CM

## 2018-06-01 MED ORDER — HUMIRA PEN CITRATE FREE 40 MG/0.4 ML
SUBCUTANEOUS | 6 refills | 0.00000 days | Status: CP
Start: 2018-06-01 — End: ?

## 2018-06-14 MED ORDER — GABAPENTIN 300 MG CAPSULE
ORAL_CAPSULE | 0 refills | 0 days | Status: CP
Start: 2018-06-14 — End: 2018-06-28

## 2018-06-28 ENCOUNTER — Encounter
Admit: 2018-06-28 | Discharge: 2018-06-29 | Payer: PRIVATE HEALTH INSURANCE | Attending: Student in an Organized Health Care Education/Training Program | Primary: Student in an Organized Health Care Education/Training Program

## 2018-06-28 DIAGNOSIS — R208 Other disturbances of skin sensation: Principal | ICD-10-CM

## 2018-06-28 MED ORDER — GABAPENTIN 100 MG CAPSULE
ORAL_CAPSULE | Freq: Three times a day (TID) | ORAL | 0 refills | 0 days | Status: CP
Start: 2018-06-28 — End: 2018-07-06

## 2018-07-06 MED ORDER — GABAPENTIN 100 MG CAPSULE
ORAL_CAPSULE | Freq: Three times a day (TID) | ORAL | 2 refills | 0 days | Status: CP
Start: 2018-07-06 — End: 2018-09-04

## 2018-07-10 MED ORDER — METHOTREXATE SODIUM 2.5 MG TABLET
ORAL_TABLET | ORAL | 3 refills | 0.00000 days | Status: CP
Start: 2018-07-10 — End: ?

## 2018-08-03 ENCOUNTER — Encounter: Admit: 2018-08-03 | Discharge: 2018-08-04 | Payer: PRIVATE HEALTH INSURANCE

## 2018-08-03 DIAGNOSIS — R208 Other disturbances of skin sensation: Principal | ICD-10-CM

## 2018-08-28 ENCOUNTER — Encounter: Admit: 2018-08-28 | Discharge: 2018-08-28 | Payer: PRIVATE HEALTH INSURANCE

## 2018-08-28 DIAGNOSIS — Q283 Other malformations of cerebral vessels: Principal | ICD-10-CM

## 2018-09-01 ENCOUNTER — Encounter: Admit: 2018-09-01 | Discharge: 2018-09-02 | Payer: PRIVATE HEALTH INSURANCE

## 2018-09-01 DIAGNOSIS — R109 Unspecified abdominal pain: Principal | ICD-10-CM

## 2018-09-01 DIAGNOSIS — R1310 Dysphagia, unspecified: Secondary | ICD-10-CM

## 2018-09-04 ENCOUNTER — Encounter: Admit: 2018-09-04 | Discharge: 2018-09-05 | Payer: PRIVATE HEALTH INSURANCE

## 2018-09-04 DIAGNOSIS — R109 Unspecified abdominal pain: Secondary | ICD-10-CM

## 2018-09-04 DIAGNOSIS — K219 Gastro-esophageal reflux disease without esophagitis: Principal | ICD-10-CM

## 2018-09-04 DIAGNOSIS — R160 Hepatomegaly, not elsewhere classified: Secondary | ICD-10-CM

## 2018-09-04 DIAGNOSIS — R1310 Dysphagia, unspecified: Secondary | ICD-10-CM

## 2018-09-04 MED ORDER — PANTOPRAZOLE 40 MG TABLET,DELAYED RELEASE
ORAL_TABLET | Freq: Every day | ORAL | 1 refills | 30 days | Status: CP
Start: 2018-09-04 — End: 2018-09-27

## 2018-09-22 ENCOUNTER — Encounter: Admit: 2018-09-22 | Discharge: 2018-09-23 | Payer: PRIVATE HEALTH INSURANCE

## 2018-09-22 DIAGNOSIS — Z5181 Encounter for therapeutic drug level monitoring: Principal | ICD-10-CM

## 2018-09-22 DIAGNOSIS — Z79899 Other long term (current) drug therapy: Secondary | ICD-10-CM

## 2018-09-25 ENCOUNTER — Encounter: Admit: 2018-09-25 | Discharge: 2018-09-26 | Payer: PRIVATE HEALTH INSURANCE

## 2018-09-25 DIAGNOSIS — R109 Unspecified abdominal pain: Principal | ICD-10-CM

## 2018-09-27 MED ORDER — PANTOPRAZOLE 40 MG TABLET,DELAYED RELEASE
ORAL_TABLET | Freq: Every day | ORAL | 1 refills | 90 days | Status: CP
Start: 2018-09-27 — End: 2019-09-27

## 2018-10-04 ENCOUNTER — Encounter: Admit: 2018-10-04 | Discharge: 2018-10-05 | Payer: PRIVATE HEALTH INSURANCE | Attending: Family | Primary: Family

## 2018-10-04 DIAGNOSIS — K219 Gastro-esophageal reflux disease without esophagitis: Principal | ICD-10-CM

## 2018-10-04 DIAGNOSIS — K760 Fatty (change of) liver, not elsewhere classified: Secondary | ICD-10-CM

## 2018-10-04 DIAGNOSIS — K828 Other specified diseases of gallbladder: Secondary | ICD-10-CM

## 2018-10-11 ENCOUNTER — Institutional Professional Consult (permissible substitution): Admit: 2018-10-11 | Discharge: 2018-10-12 | Payer: PRIVATE HEALTH INSURANCE

## 2018-10-11 DIAGNOSIS — J45909 Unspecified asthma, uncomplicated: Secondary | ICD-10-CM

## 2018-10-11 DIAGNOSIS — K219 Gastro-esophageal reflux disease without esophagitis: Secondary | ICD-10-CM

## 2018-10-11 DIAGNOSIS — R1013 Epigastric pain: Secondary | ICD-10-CM

## 2018-10-11 DIAGNOSIS — R109 Unspecified abdominal pain: Secondary | ICD-10-CM

## 2018-10-11 DIAGNOSIS — K760 Fatty (change of) liver, not elsewhere classified: Secondary | ICD-10-CM

## 2018-10-11 DIAGNOSIS — R160 Hepatomegaly, not elsewhere classified: Secondary | ICD-10-CM

## 2018-10-11 DIAGNOSIS — R1310 Dysphagia, unspecified: Secondary | ICD-10-CM

## 2018-10-23 DIAGNOSIS — M1990 Unspecified osteoarthritis, unspecified site: Secondary | ICD-10-CM

## 2018-10-23 DIAGNOSIS — E669 Obesity, unspecified: Secondary | ICD-10-CM

## 2018-10-23 DIAGNOSIS — R1013 Epigastric pain: Secondary | ICD-10-CM

## 2018-10-23 DIAGNOSIS — K219 Gastro-esophageal reflux disease without esophagitis: Secondary | ICD-10-CM

## 2018-10-23 DIAGNOSIS — R1310 Dysphagia, unspecified: Secondary | ICD-10-CM

## 2018-10-23 DIAGNOSIS — R140 Abdominal distension (gaseous): Secondary | ICD-10-CM

## 2018-10-23 DIAGNOSIS — J45909 Unspecified asthma, uncomplicated: Secondary | ICD-10-CM

## 2018-10-25 ENCOUNTER — Encounter: Admit: 2018-10-25 | Discharge: 2018-10-26 | Payer: PRIVATE HEALTH INSURANCE | Attending: Family | Primary: Family

## 2018-10-25 DIAGNOSIS — S29012A Strain of muscle and tendon of back wall of thorax, initial encounter: Secondary | ICD-10-CM

## 2018-10-25 MED ORDER — DICLOFENAC 1 % TOPICAL GEL
Freq: Four times a day (QID) | TOPICAL | 0 refills | 13 days | Status: CP
Start: 2018-10-25 — End: 2019-10-25

## 2018-10-25 MED ORDER — BACLOFEN 10 MG TABLET
ORAL_TABLET | Freq: Three times a day (TID) | ORAL | 0 refills | 20.00000 days | Status: CP
Start: 2018-10-25 — End: 2019-10-25

## 2018-11-01 ENCOUNTER — Encounter: Admit: 2018-11-01 | Discharge: 2018-11-02 | Payer: PRIVATE HEALTH INSURANCE

## 2018-11-01 DIAGNOSIS — M1380 Other specified arthritis, unspecified site: Secondary | ICD-10-CM

## 2018-11-01 DIAGNOSIS — K760 Fatty (change of) liver, not elsewhere classified: Secondary | ICD-10-CM

## 2018-11-06 ENCOUNTER — Ambulatory Visit: Admit: 2018-11-06 | Discharge: 2018-11-07 | Payer: PRIVATE HEALTH INSURANCE | Attending: Family | Primary: Family

## 2018-11-06 DIAGNOSIS — Z1159 Encounter for screening for other viral diseases: Secondary | ICD-10-CM

## 2018-11-06 DIAGNOSIS — J029 Acute pharyngitis, unspecified: Secondary | ICD-10-CM

## 2018-11-06 DIAGNOSIS — Z20828 Contact with and (suspected) exposure to other viral communicable diseases: Secondary | ICD-10-CM

## 2018-11-08 DIAGNOSIS — R1013 Epigastric pain: Secondary | ICD-10-CM

## 2018-11-08 DIAGNOSIS — M1990 Unspecified osteoarthritis, unspecified site: Secondary | ICD-10-CM

## 2018-11-08 DIAGNOSIS — R1310 Dysphagia, unspecified: Secondary | ICD-10-CM

## 2018-11-08 DIAGNOSIS — R140 Abdominal distension (gaseous): Secondary | ICD-10-CM

## 2018-11-08 DIAGNOSIS — K219 Gastro-esophageal reflux disease without esophagitis: Secondary | ICD-10-CM

## 2018-11-08 DIAGNOSIS — J45909 Unspecified asthma, uncomplicated: Secondary | ICD-10-CM

## 2018-11-08 DIAGNOSIS — E669 Obesity, unspecified: Secondary | ICD-10-CM

## 2018-11-09 ENCOUNTER — Encounter
Admit: 2018-11-09 | Discharge: 2018-11-09 | Payer: PRIVATE HEALTH INSURANCE | Attending: Anesthesiology | Primary: Anesthesiology

## 2018-11-09 ENCOUNTER — Encounter: Admit: 2018-11-09 | Discharge: 2018-11-09 | Payer: PRIVATE HEALTH INSURANCE

## 2018-11-09 DIAGNOSIS — J45909 Unspecified asthma, uncomplicated: Secondary | ICD-10-CM

## 2018-11-09 DIAGNOSIS — E669 Obesity, unspecified: Secondary | ICD-10-CM

## 2018-11-09 DIAGNOSIS — R140 Abdominal distension (gaseous): Secondary | ICD-10-CM

## 2018-11-09 DIAGNOSIS — R1310 Dysphagia, unspecified: Secondary | ICD-10-CM

## 2018-11-09 DIAGNOSIS — R1013 Epigastric pain: Secondary | ICD-10-CM

## 2018-11-09 DIAGNOSIS — K219 Gastro-esophageal reflux disease without esophagitis: Secondary | ICD-10-CM

## 2018-11-09 DIAGNOSIS — M1990 Unspecified osteoarthritis, unspecified site: Secondary | ICD-10-CM

## 2018-11-22 ENCOUNTER — Encounter: Admit: 2018-11-22 | Discharge: 2018-11-23 | Payer: PRIVATE HEALTH INSURANCE | Attending: Family | Primary: Family

## 2018-11-22 DIAGNOSIS — M25512 Pain in left shoulder: Principal | ICD-10-CM

## 2018-11-22 DIAGNOSIS — R200 Anesthesia of skin: Principal | ICD-10-CM

## 2018-11-27 ENCOUNTER — Encounter: Admit: 2018-11-27 | Discharge: 2018-11-28 | Payer: PRIVATE HEALTH INSURANCE

## 2018-11-27 DIAGNOSIS — R945 Abnormal results of liver function studies: Principal | ICD-10-CM

## 2018-11-27 DIAGNOSIS — M1380 Other specified arthritis, unspecified site: Principal | ICD-10-CM

## 2018-11-27 MED ORDER — FOLIC ACID 1 MG TABLET: 1 mg | tablet | Freq: Every day | 11 refills | 30 days | Status: AC

## 2018-11-27 MED ORDER — METHOTREXATE SODIUM 2.5 MG TABLET: 15 mg | tablet | 1 refills | 28 days | Status: AC

## 2018-11-29 ENCOUNTER — Ambulatory Visit
Admit: 2018-11-29 | Discharge: 2018-11-30 | Payer: PRIVATE HEALTH INSURANCE | Attending: Rehabilitative and Restorative Service Providers" | Primary: Rehabilitative and Restorative Service Providers"

## 2018-11-29 ENCOUNTER — Ambulatory Visit: Admit: 2018-11-29 | Discharge: 2018-11-30 | Payer: PRIVATE HEALTH INSURANCE

## 2018-11-29 MED ORDER — PREDNISONE 10 MG TABLET: tablet | 0 refills | 0 days | Status: AC

## 2018-11-29 MED ORDER — DIAZEPAM 5 MG TABLET: 5 mg | tablet | Freq: Every evening | 0 refills | 20 days | Status: AC

## 2018-12-07 DIAGNOSIS — M5412 Radiculopathy, cervical region: Principal | ICD-10-CM

## 2018-12-14 ENCOUNTER — Encounter: Admit: 2018-12-14 | Discharge: 2018-12-15 | Payer: PRIVATE HEALTH INSURANCE

## 2018-12-22 ENCOUNTER — Other Ambulatory Visit: Payer: Self-pay | Admitting: Family Medicine

## 2018-12-22 DIAGNOSIS — Z20822 Contact with and (suspected) exposure to covid-19: Secondary | ICD-10-CM

## 2018-12-25 ENCOUNTER — Encounter
Admit: 2018-12-25 | Discharge: 2018-12-26 | Payer: PRIVATE HEALTH INSURANCE | Attending: Rehabilitative and Restorative Service Providers" | Primary: Rehabilitative and Restorative Service Providers"

## 2018-12-29 ENCOUNTER — Other Ambulatory Visit: Payer: Self-pay

## 2018-12-29 DIAGNOSIS — Z20822 Contact with and (suspected) exposure to covid-19: Secondary | ICD-10-CM

## 2018-12-31 LAB — NOVEL CORONAVIRUS, NAA: SARS-CoV-2, NAA: NOT DETECTED

## 2019-01-03 ENCOUNTER — Encounter
Admit: 2019-01-03 | Discharge: 2019-01-04 | Payer: PRIVATE HEALTH INSURANCE | Attending: Physical Medicine & Rehabilitation | Primary: Physical Medicine & Rehabilitation

## 2019-01-03 DIAGNOSIS — M5412 Radiculopathy, cervical region: Principal | ICD-10-CM

## 2019-01-14 ENCOUNTER — Encounter: Admit: 2019-01-14 | Discharge: 2019-01-15 | Payer: PRIVATE HEALTH INSURANCE

## 2019-02-08 ENCOUNTER — Encounter
Admit: 2019-02-08 | Discharge: 2019-02-09 | Payer: PRIVATE HEALTH INSURANCE | Attending: Physical Medicine & Rehabilitation | Primary: Physical Medicine & Rehabilitation

## 2019-02-08 ENCOUNTER — Ambulatory Visit: Admit: 2019-02-08 | Discharge: 2019-02-09 | Payer: PRIVATE HEALTH INSURANCE

## 2019-02-08 DIAGNOSIS — M5030 Other cervical disc degeneration, unspecified cervical region: Principal | ICD-10-CM

## 2019-03-26 ENCOUNTER — Encounter: Admit: 2019-03-26 | Discharge: 2019-03-27 | Payer: PRIVATE HEALTH INSURANCE

## 2019-03-26 DIAGNOSIS — M1380 Other specified arthritis, unspecified site: Principal | ICD-10-CM

## 2019-03-26 MED ORDER — METHOTREXATE SODIUM 2.5 MG TABLET
ORAL_TABLET | ORAL | 1 refills | 28.00000 days | Status: CP
Start: 2019-03-26 — End: ?

## 2019-04-15 ENCOUNTER — Ambulatory Visit: Payer: BC Managed Care – PPO | Attending: Internal Medicine

## 2019-04-15 DIAGNOSIS — Z23 Encounter for immunization: Secondary | ICD-10-CM | POA: Insufficient documentation

## 2019-04-15 NOTE — Progress Notes (Signed)
   Covid-19 Vaccination Clinic  Name:  Charles Schroeder    MRN: [id number] DOB: 1990/07/11  04/15/2019  Mr. Charles Schroeder was observed post Covid-19 immunization for 15 minutes without incident. He was provided with Vaccine Information Sheet and instruction to access the V-Safe system.   Mr. Charles Schroeder was instructed to call 911 with any severe reactions post vaccine: Marland Kitchen Difficulty breathing  . Swelling of face and throat  . A fast heartbeat  . A bad rash all over body  . Dizziness and weakness   Immunizations Administered    Name Date Dose VIS Date Route   Pfizer COVID-19 Vaccine 04/15/2019 10:29 AM 0.3 mL 01/19/2019 Intramuscular   Manufacturer: [employer], [employer]   Lot: [id number]   NDC: [address]-7

## 2019-05-08 ENCOUNTER — Ambulatory Visit: Payer: BC Managed Care – PPO | Attending: Internal Medicine

## 2019-05-08 DIAGNOSIS — Z23 Encounter for immunization: Secondary | ICD-10-CM

## 2019-05-08 NOTE — Progress Notes (Signed)
   Covid-19 Vaccination Clinic  Name:  Moses Odoherty    MRN: [id number] DOB: [birth year]  05/08/2019  Mr. Niehoff was observed post Covid-19 immunization for 15 minutes without incident. He was provided with Vaccine Information Sheet and instruction to access the V-Safe system.   Mr. Cada was instructed to call 911 with any severe reactions post vaccine: Marland Kitchen Difficulty breathing  . Swelling of face and throat  . A fast heartbeat  . A bad rash all over body  . Dizziness and weakness   Immunizations Administered    Name Date Dose VIS Date Route   Pfizer COVID-19 Vaccine 05/08/2019  8:48 AM 0.3 mL 01/19/2019 Intramuscular   Manufacturer: [employer], [employer]   Lot: [id number]   NDC: [address]-8

## 2019-05-22 ENCOUNTER — Encounter: Admit: 2019-05-22 | Discharge: 2019-05-23 | Payer: PRIVATE HEALTH INSURANCE | Attending: Family | Primary: Family

## 2019-05-22 DIAGNOSIS — M79672 Pain in left foot: Principal | ICD-10-CM

## 2019-05-22 MED ORDER — PREDNISONE 20 MG TABLET
ORAL_TABLET | 0 refills | 0 days | Status: CP
Start: 2019-05-22 — End: ?

## 2019-06-11 ENCOUNTER — Encounter: Admit: 2019-06-11 | Discharge: 2019-06-12 | Payer: PRIVATE HEALTH INSURANCE

## 2019-06-11 MED ORDER — METHOTREXATE SODIUM 2.5 MG TABLET
ORAL_TABLET | ORAL | 3 refills | 35.00000 days | Status: CP
Start: 2019-06-11 — End: 2019-06-11

## 2019-06-11 MED ORDER — METHOTREXATE SODIUM 2.5 MG TABLET: 15 mg | tablet | 3 refills | 28 days | Status: AC

## 2019-06-11 MED ORDER — HUMIRA PEN CITRATE FREE 40 MG/0.4 ML
SUBCUTANEOUS | 6 refills | 28.00000 days | Status: CP
Start: 2019-06-11 — End: ?

## 2019-06-13 ENCOUNTER — Ambulatory Visit: Admit: 2019-06-13 | Discharge: 2019-06-14 | Payer: PRIVATE HEALTH INSURANCE | Attending: Family | Primary: Family

## 2019-06-13 DIAGNOSIS — M1380 Other specified arthritis, unspecified site: Principal | ICD-10-CM

## 2019-06-13 DIAGNOSIS — M238X1 Other internal derangements of right knee: Principal | ICD-10-CM

## 2019-06-15 ENCOUNTER — Encounter: Admit: 2019-06-15 | Discharge: 2019-06-16 | Payer: PRIVATE HEALTH INSURANCE

## 2019-06-19 DIAGNOSIS — M1380 Other specified arthritis, unspecified site: Principal | ICD-10-CM

## 2019-06-19 MED ORDER — METHOTREXATE SODIUM 2.5 MG TABLET
ORAL_TABLET | ORAL | 0 refills | 84.00000 days | Status: CP
Start: 2019-06-19 — End: ?

## 2019-06-25 DIAGNOSIS — Z79899 Other long term (current) drug therapy: Principal | ICD-10-CM

## 2019-07-06 ENCOUNTER — Ambulatory Visit: Admit: 2019-07-06 | Discharge: 2019-07-07 | Payer: PRIVATE HEALTH INSURANCE

## 2019-07-18 ENCOUNTER — Encounter: Admit: 2019-07-18 | Discharge: 2019-07-19 | Payer: PRIVATE HEALTH INSURANCE

## 2019-07-23 DIAGNOSIS — Z5181 Encounter for therapeutic drug level monitoring: Principal | ICD-10-CM

## 2019-07-23 DIAGNOSIS — M1380 Other specified arthritis, unspecified site: Principal | ICD-10-CM

## 2019-07-23 DIAGNOSIS — Z79899 Other long term (current) drug therapy: Principal | ICD-10-CM

## 2019-07-23 MED ORDER — METHOTREXATE SODIUM 2.5 MG TABLET
ORAL_TABLET | ORAL | 0 refills | 84.00000 days | Status: CP
Start: 2019-07-23 — End: ?

## 2019-09-26 ENCOUNTER — Encounter: Admit: 2019-09-26 | Discharge: 2019-09-27 | Payer: PRIVATE HEALTH INSURANCE | Attending: Family | Primary: Family

## 2019-09-26 DIAGNOSIS — M79672 Pain in left foot: Principal | ICD-10-CM

## 2019-09-26 MED ORDER — PREDNISONE 20 MG TABLET
ORAL_TABLET | 0 refills | 0 days | Status: CP
Start: 2019-09-26 — End: ?

## 2019-10-19 ENCOUNTER — Ambulatory Visit: Admit: 2019-10-19 | Discharge: 2019-10-20 | Payer: PRIVATE HEALTH INSURANCE

## 2019-10-19 DIAGNOSIS — M1380 Other specified arthritis, unspecified site: Principal | ICD-10-CM

## 2019-10-19 MED ORDER — METHOTREXATE SODIUM 2.5 MG TABLET
ORAL_TABLET | ORAL | 0 refills | 112.00000 days | Status: CP
Start: 2019-10-19 — End: ?

## 2019-11-20 ENCOUNTER — Ambulatory Visit: Admit: 2019-11-20 | Discharge: 2019-11-21 | Payer: PRIVATE HEALTH INSURANCE

## 2019-11-20 DIAGNOSIS — R21 Rash and other nonspecific skin eruption: Principal | ICD-10-CM

## 2019-11-20 MED ORDER — METHYLPREDNISOLONE 4 MG TABLETS IN A DOSE PACK
0 refills | 0 days | Status: CP
Start: 2019-11-20 — End: ?

## 2019-11-30 ENCOUNTER — Ambulatory Visit: Admit: 2019-11-30 | Discharge: 2019-12-01 | Payer: PRIVATE HEALTH INSURANCE | Attending: Family | Primary: Family

## 2019-11-30 DIAGNOSIS — R21 Rash and other nonspecific skin eruption: Principal | ICD-10-CM

## 2019-12-25 ENCOUNTER — Encounter
Admit: 2019-12-25 | Discharge: 2019-12-26 | Payer: PRIVATE HEALTH INSURANCE | Attending: Dermatology | Primary: Dermatology

## 2019-12-25 ENCOUNTER — Encounter: Admit: 2019-12-25 | Discharge: 2019-12-26 | Payer: PRIVATE HEALTH INSURANCE

## 2019-12-25 DIAGNOSIS — M1711 Unilateral primary osteoarthritis, right knee: Principal | ICD-10-CM

## 2019-12-25 DIAGNOSIS — M1990 Unspecified osteoarthritis, unspecified site: Principal | ICD-10-CM

## 2019-12-25 DIAGNOSIS — J45909 Unspecified asthma, uncomplicated: Principal | ICD-10-CM

## 2019-12-25 DIAGNOSIS — Z79899 Other long term (current) drug therapy: Principal | ICD-10-CM

## 2019-12-25 DIAGNOSIS — Z96651 Presence of right artificial knee joint: Principal | ICD-10-CM

## 2019-12-25 DIAGNOSIS — R233 Spontaneous ecchymoses: Principal | ICD-10-CM

## 2019-12-25 MED ORDER — TRIAMCINOLONE ACETONIDE 0.1 % TOPICAL CREAM
Freq: Two times a day (BID) | TOPICAL | 0 refills | 0.00000 days | Status: CP
Start: 2019-12-25 — End: 2020-12-24

## 2019-12-26 ENCOUNTER — Ambulatory Visit (INDEPENDENT_AMBULATORY_CARE_PROVIDER_SITE_OTHER): Payer: BC Managed Care – PPO | Admitting: Psychology

## 2019-12-26 DIAGNOSIS — F4323 Adjustment disorder with mixed anxiety and depressed mood: Secondary | ICD-10-CM

## 2020-01-08 DIAGNOSIS — M1380 Other specified arthritis, unspecified site: Principal | ICD-10-CM

## 2020-01-08 MED ORDER — ORENCIA CLICKJECT 125 MG/ML SUBCUTANEOUS AUTO-INJECTOR
SUBCUTANEOUS | 5 refills | 0.00000 days | Status: CP
Start: 2020-01-08 — End: 2020-01-31

## 2020-01-09 ENCOUNTER — Ambulatory Visit (INDEPENDENT_AMBULATORY_CARE_PROVIDER_SITE_OTHER): Payer: BC Managed Care – PPO | Admitting: Psychology

## 2020-01-09 DIAGNOSIS — F4323 Adjustment disorder with mixed anxiety and depressed mood: Secondary | ICD-10-CM

## 2020-01-23 ENCOUNTER — Ambulatory Visit: Payer: BC Managed Care – PPO | Admitting: Psychology

## 2020-01-29 ENCOUNTER — Ambulatory Visit (INDEPENDENT_AMBULATORY_CARE_PROVIDER_SITE_OTHER): Payer: BC Managed Care – PPO | Admitting: Psychology

## 2020-01-29 DIAGNOSIS — F4323 Adjustment disorder with mixed anxiety and depressed mood: Secondary | ICD-10-CM | POA: Diagnosis not present

## 2020-01-31 DIAGNOSIS — M1990 Unspecified osteoarthritis, unspecified site: Principal | ICD-10-CM

## 2020-01-31 MED ORDER — COSENTYX PEN 150 MG/ML SUBCUTANEOUS: mL | 0 refills | 0 days | Status: AC

## 2020-01-31 MED ORDER — COSENTYX PEN 150 MG/ML SUBCUTANEOUS
SUBCUTANEOUS | 8 refills | 28.00000 days | Status: CP
Start: 2020-01-31 — End: 2020-02-20

## 2020-01-31 MED ORDER — COSENTYX PEN 150 MG/ML SUBCUTANEOUS: 150 mg | mL | 8 refills | 28 days | Status: AC

## 2020-02-06 DIAGNOSIS — M1990 Unspecified osteoarthritis, unspecified site: Principal | ICD-10-CM

## 2020-02-12 DIAGNOSIS — M0600 Rheumatoid arthritis without rheumatoid factor, unspecified site: Principal | ICD-10-CM

## 2020-02-13 ENCOUNTER — Ambulatory Visit (INDEPENDENT_AMBULATORY_CARE_PROVIDER_SITE_OTHER): Payer: BC Managed Care – PPO | Admitting: Psychology

## 2020-02-13 DIAGNOSIS — F4323 Adjustment disorder with mixed anxiety and depressed mood: Secondary | ICD-10-CM

## 2020-02-20 ENCOUNTER — Ambulatory Visit: Admit: 2020-02-20 | Discharge: 2020-02-21 | Payer: PRIVATE HEALTH INSURANCE | Attending: Family | Primary: Family

## 2020-02-20 DIAGNOSIS — J387 Other diseases of larynx: Principal | ICD-10-CM

## 2020-02-20 DIAGNOSIS — M1990 Unspecified osteoarthritis, unspecified site: Principal | ICD-10-CM

## 2020-02-20 DIAGNOSIS — K21 Gastroesophageal reflux disease with esophagitis without hemorrhage: Principal | ICD-10-CM

## 2020-02-20 MED ORDER — PANTOPRAZOLE 40 MG TABLET,DELAYED RELEASE
ORAL_TABLET | Freq: Every day | ORAL | 2 refills | 14.00000 days | Status: CP
Start: 2020-02-20 — End: 2020-03-05

## 2020-02-20 MED ORDER — ACTEMRA ACTPEN 162 MG/0.9 ML SUBCUTANEOUS PEN INJECTOR
SUBCUTANEOUS | 5 refills | 0.00000 days | Status: CP
Start: 2020-02-20 — End: ?

## 2020-02-20 MED ORDER — DUKE'S MAGIC MOUTHWASH ORAL SUSPENSION
Freq: Four times a day (QID) | ORAL | 0 refills | 6 days | Status: CP
Start: 2020-02-20 — End: ?

## 2020-02-28 ENCOUNTER — Ambulatory Visit (INDEPENDENT_AMBULATORY_CARE_PROVIDER_SITE_OTHER): Payer: BC Managed Care – PPO | Admitting: Psychology

## 2020-02-28 DIAGNOSIS — F4323 Adjustment disorder with mixed anxiety and depressed mood: Secondary | ICD-10-CM

## 2020-03-06 ENCOUNTER — Encounter: Admit: 2020-03-06 | Discharge: 2020-03-07 | Payer: PRIVATE HEALTH INSURANCE | Attending: Family | Primary: Family

## 2020-03-06 DIAGNOSIS — E782 Mixed hyperlipidemia: Principal | ICD-10-CM

## 2020-03-06 DIAGNOSIS — B078 Other viral warts: Principal | ICD-10-CM

## 2020-03-06 DIAGNOSIS — Z0000 Encounter for general adult medical examination without abnormal findings: Principal | ICD-10-CM

## 2020-03-12 ENCOUNTER — Ambulatory Visit (INDEPENDENT_AMBULATORY_CARE_PROVIDER_SITE_OTHER): Payer: BC Managed Care – PPO | Admitting: Psychology

## 2020-03-12 DIAGNOSIS — F4323 Adjustment disorder with mixed anxiety and depressed mood: Secondary | ICD-10-CM | POA: Diagnosis not present

## 2020-03-24 ENCOUNTER — Ambulatory Visit: Payer: BC Managed Care – PPO | Admitting: Psychology

## 2020-04-02 ENCOUNTER — Telehealth: Admit: 2020-04-02 | Discharge: 2020-04-03 | Payer: PRIVATE HEALTH INSURANCE

## 2020-04-02 DIAGNOSIS — U071 COVID-19 virus detected: Principal | ICD-10-CM

## 2020-04-02 MED ORDER — METHOTREXATE SODIUM 2.5 MG TABLET
ORAL_TABLET | ORAL | 0 refills | 112 days | Status: CP
Start: 2020-04-02 — End: ?

## 2020-04-03 ENCOUNTER — Encounter: Admit: 2020-04-03 | Discharge: 2020-04-04 | Payer: PRIVATE HEALTH INSURANCE

## 2020-04-03 DIAGNOSIS — U071 COVID-19 virus detected: Principal | ICD-10-CM

## 2020-04-28 DIAGNOSIS — M1380 Other specified arthritis, unspecified site: Principal | ICD-10-CM

## 2020-04-28 MED ORDER — PREDNISONE 10 MG TABLET
ORAL_TABLET | 0 refills | 0 days | Status: CP
Start: 2020-04-28 — End: ?

## 2020-05-18 DIAGNOSIS — M1380 Other specified arthritis, unspecified site: Principal | ICD-10-CM

## 2020-05-18 MED ORDER — PREDNISONE 10 MG TABLET
ORAL_TABLET | ORAL | 0 refills | 0.00000 days | Status: CP
Start: 2020-05-18 — End: ?

## 2020-06-14 DIAGNOSIS — M1380 Other specified arthritis, unspecified site: Principal | ICD-10-CM

## 2020-07-13 ENCOUNTER — Emergency Department (HOSPITAL_COMMUNITY)
Admission: EM | Admit: 2020-07-13 | Discharge: 2020-07-13 | Disposition: A | Discharge status: Home / Self Care | Payer: BC Managed Care – PPO | Attending: Emergency Medicine | Admitting: Emergency Medicine

## 2020-07-13 ENCOUNTER — Encounter (HOSPITAL_COMMUNITY): Payer: Self-pay

## 2020-07-13 ENCOUNTER — Emergency Department (HOSPITAL_COMMUNITY): Payer: BC Managed Care – PPO

## 2020-07-13 DIAGNOSIS — R0789 Other chest pain: Secondary | ICD-10-CM | POA: Diagnosis present

## 2020-07-13 DIAGNOSIS — M25561 Pain in right knee: Secondary | ICD-10-CM | POA: Insufficient documentation

## 2020-07-13 LAB — CBC
HCT: 46.5 % (ref 39.0–52.0)
Hemoglobin: 16.1 g/dL (ref 13.0–17.0)
MCH: 30.2 pg (ref 26.0–34.0)
MCHC: 34.6 g/dL (ref 30.0–36.0)
MCV: 87.2 fL (ref 80.0–100.0)
Platelets: 166 10*3/uL (ref 150–400)
RBC: 5.33 MIL/uL (ref 4.22–5.81)
RDW: 13.2 % (ref 11.5–15.5)
WBC: 9.7 10*3/uL (ref 4.0–10.5)
nRBC: 0 % (ref 0.0–0.2)

## 2020-07-13 LAB — BASIC METABOLIC PANEL
Anion gap: 10 (ref 5–15)
BUN: 11 mg/dL (ref 6–20)
CO2: 28 mmol/L (ref 22–32)
Calcium: 9.9 mg/dL (ref 8.9–10.3)
Chloride: 100 mmol/L (ref 98–111)
Creatinine, Ser: 1.14 mg/dL (ref 0.61–1.24)
GFR, Estimated: >60 mL/min (ref >60)
Glucose, Bld: 127 mg/dL — ABNORMAL HIGH (ref 70–99)
Potassium: 3.9 mmol/L (ref 3.5–5.1)
Sodium: 138 mmol/L (ref 135–145)

## 2020-07-13 LAB — TROPONIN I (HIGH SENSITIVITY): Troponin I (High Sensitivity): <2 ng/L (ref <18)

## 2020-07-13 LAB — PROTIME-INR
INR: 1 (ref 0.8–1.2)
Prothrombin Time: 13.3 seconds (ref 11.4–15.2)

## 2020-07-13 MED ORDER — PREDNISONE 10 MG (21) PO TBPK: ORAL_TABLET | Freq: Every day | ORAL | 0 refills | Status: DC

## 2020-07-13 MED ORDER — HYDROCODONE-ACETAMINOPHEN 5-325 MG PO TABS: 1.0000 | ORAL_TABLET | Freq: Four times a day (QID) | ORAL | 0 refills | Status: AC | PRN

## 2020-07-13 NOTE — Discharge Instructions (Signed)
Take the steroids to help with your rheumatoid arthritis flareup.  Take the pain medicine as needed. Follow-up with your rheumatologist, orthopedist and primary care provider. Return to the ER if you start to experience worsening chest pain, shortness of breath, increased joint swelling, redness or injury

## 2020-07-13 NOTE — ED Provider Notes (Signed)
Emergency Medicine Provider Triage Evaluation Note  Charles Schroeder , a 30 y.o. male  was evaluated in triage.  Pt complains of right knee pain.  He is concerned that this is a flareup of his RA.  He was at the orthopedic office today when he became diaphoretic, short of breath and pale so they sent him to the ER.  States that he is still concerned because his knee has not been addressed.  States that he feels better as far as his diaphoresis and shortness of breath.  He typically treats the RA flareup with steroids.  The rheumatologist sent him to the orthopedist today so he could possibly have a joint injection instead.  He denies any chest pain or fever.  Review of Systems  Positive: Right knee pain, diaphoresis, shortness of Negative: Chest pain, fever  Physical Exam  BP 131/80 (BP Location: Left Arm)   Pulse (!) 101   Temp 99.2 F (37.3 C) (Oral)   Resp 18   Ht 6\' 1"  (1.854 m)   Wt 111.1 kg   SpO2 99%   BMI 32.32 kg/m  Gen:   Awake, no distress   Resp:  Normal effort  MSK:   Moves extremities without difficulty  Other:  Tenderness of the right knee without overlying skin change  Medical Decision Making  Medically screening exam initiated at 12:42 PM.  Appropriate orders placed.  Charles Schroeder was informed that the remainder of the evaluation will be completed by another provider, this initial triage assessment does not replace that evaluation, and the importance of remaining in the ED until their evaluation is complete.  Lab work ordered   Renold Don, PA-C 07/13/20 1243    09/12/20, MD 07/14/20 [phone]

## 2020-07-13 NOTE — ED Provider Notes (Signed)
[facility]-EMERGENCY DEPT Provider Note   CSN: [id number] Arrival date & time: 07/13/20  1131     History Chief Complaint  Patient presents with  . Chest Pain    Jessi Jessop is a 30 y.o. male with a past medical history of rheumatoid arthritis presenting to the ED for right knee pain.  He is concerned that this was due to a rheumatoid arthritis flareup.  He was at the orthopedist office today when he became short of breath, diaphoretic and was sent to the ER.  States that his symptoms have subsided without intervention.  He is unsure if those symptoms were related to extreme pain in his right knee caused by his rheumatoid arthritis.  He is typically treated with prednisone.  He was sent to the orthopedist from his rheumatologist office due to possible injection that they could do.  He denies any injury or trauma, history of DVT or fever.  Denies any chest pain or cough  HPI     Past Medical History:  Diagnosis Date  . Environmental allergies   . Rheumatoid arthritis (HCC)     There are no problems to display for this patient.   Past Surgical History:  Procedure Laterality Date  . KNEE SURGERY    . MANDIBLE SURGERY    . TYMPANOSTOMY TUBE PLACEMENT    . WISDOM TOOTH EXTRACTION         No family history on file.  Social History   Tobacco Use  . Smoking status: Never Smoker  . Smokeless tobacco: Never Used  Substance Use Topics  . Alcohol use: Yes    Comment: socially  . Drug use: No    Home Medications Prior to Admission medications   Medication Sig Start Date End Date Taking? Authorizing Provider  HYDROcodone-acetaminophen (NORCO/VICODIN) 5-325 MG tablet Take 1 tablet by mouth every 6 (six) hours as needed. 07/13/20  Yes Honesty Menta, PA-C  predniSONE (STERAPRED UNI-PAK 21 TAB) 10 MG (21) TBPK tablet Take by mouth daily. Take 6 tabs by mouth daily  for 2 days, then 5 tabs for 2 days, then 4 tabs for 2 days, then 3 tabs for 2 days, 2 tabs for 2  days, then 1 tab by mouth daily for 2 days 07/13/20  Yes Dareen Gutzwiller, PA-C  diphenhydrAMINE (BENADRYL) 25 MG tablet Take 1 tablet (25 mg total) by mouth every 6 (six) hours as needed for itching (Rash). 11/02/13   Eber Hong, MD  EPINEPHrine 0.3 mg/0.3 mL IJ SOAJ injection Inject 0.3 mLs (0.3 mg total) into the muscle once. 11/02/13   Blane Ohara, MD  famotidine (PEPCID) 20 MG tablet Take 1 tablet (20 mg total) by mouth 2 (two) times daily. 11/02/13   Eber Hong, MD  folic acid (FOLVITE) 1 MG tablet Take 3 mg by mouth daily.  10/28/13   [provider]  HUMIRA PEN 40 MG/0.8ML PNKT Inject 0.4 mLs into the skin every 14 (fourteen) days.  09/14/13   [provider]  methotrexate (RHEUMATREX) 5 MG tablet Take 25 mg by mouth once a week. Caution: Chemotherapy. Protect from light.    [provider]    Allergies    Bactrim [sulfamethoxazole-trimethoprim], Dog epithelium allergy skin test, Dust mite extract, Pollen extract, and Sulfa antibiotics  Review of Systems   Review of Systems  Constitutional: Positive for diaphoresis. Negative for appetite change, chills and fever.  HENT: Negative for ear pain, rhinorrhea, sneezing and sore throat.   Eyes: Negative for photophobia and  visual disturbance.  Respiratory: Positive for shortness of breath. Negative for cough, chest tightness and wheezing.   Cardiovascular: Negative for chest pain and palpitations.  Gastrointestinal: Negative for abdominal pain, blood in stool, constipation, diarrhea, nausea and vomiting.  Genitourinary: Negative for dysuria, hematuria and urgency.  Musculoskeletal: Positive for arthralgias. Negative for myalgias.  Skin: Negative for rash.  Neurological: Negative for dizziness, weakness and light-headedness.    Physical Exam Updated Vital Signs BP (!) 148/73   Pulse 90   Temp 99.2 F (37.3 C) (Oral)   Resp 18   Ht  (1.854 m)   Wt 111.1 kg   SpO2 99%   BMI 32.32 kg/m   Physical  Exam Vitals and nursing note reviewed.  Constitutional:      General: He is not in acute distress.    Appearance: He is well-developed.     Comments: No signs of respiratory distress.  Speaking complete sentences.  HENT:     Head: Normocephalic and atraumatic.     Nose: Nose normal.  Eyes:     General: No scleral icterus.       Left eye: No discharge.     Conjunctiva/sclera: Conjunctivae normal.  Cardiovascular:     Rate and Rhythm: Normal rate and regular rhythm.     Heart sounds: Normal heart sounds. No murmur heard. No friction rub. No gallop.   Pulmonary:     Effort: Pulmonary effort is normal. No respiratory distress.     Breath sounds: Normal breath sounds.  Abdominal:     General: Bowel sounds are normal. There is no distension.     Palpations: Abdomen is soft.     Tenderness: There is no abdominal tenderness. There is no guarding.  Musculoskeletal:        General: Normal range of motion.     Cervical back: Normal range of motion and neck supple.     Right lower leg: Tenderness present.     Comments: Tenderness of the right knee without overlying erythema, edema or warmth of joint.  Skin:    General: Skin is warm and dry.     Findings: No rash.  Neurological:     Mental Status: He is alert.     Motor: No abnormal muscle tone.     Coordination: Coordination normal.     ED Results / Procedures / Treatments   Labs (all labs ordered are listed, but only abnormal results are displayed) Labs Reviewed  BASIC METABOLIC PANEL - Abnormal; Notable for the following components:      Result Value   Glucose, Bld 127 (*)    All other components within normal limits  CBC  PROTIME-INR  TROPONIN I (HIGH SENSITIVITY)    EKG EKG Interpretation  Date/Time:  Sunday July 13 2020 11:39:50 EDT Ventricular Rate:  93 PR Interval:  158 QRS Duration: 81 QT Interval:  323 QTC Calculation: 402 R Axis:   29 Text Interpretation: Sinus rhythm Probable left atrial enlargement 12  Lead; Mason-Likar Confirmed by Lorre Nick ([id number]) on 07/13/2020 2:04:23 PM   Radiology DG Chest 2 View  Result Date: 07/13/2020 CLINICAL DATA:  Chest pain and shortness of breath EXAM: CHEST - 2 VIEW COMPARISON:  November 02, 2013 FINDINGS: Lungs are clear. Heart size and pulmonary vascularity are normal. No adenopathy. No pneumothorax. No bone lesions. IMPRESSION: Lungs clear.  Heart size normal. Electronically Signed   By: Bretta Bang III M.D.   On: 07/13/2020 12:14    Procedures Procedures  Medications Ordered in ED Medications - No data to display  ED Course  I have reviewed the triage vital signs and the nursing notes.  Pertinent labs & imaging results that were available during my care of the patient were reviewed by me and considered in my medical decision making (see chart for details).    MDM Rules/Calculators/A&P                          30 year old male with past medical history of rheumatoid arthritis presenting to the ED for right knee pain.  He is concerned that his knee pain is due to her RA flareup.  He was at the orthopedist office today for possible injection when he became diaphoretic and short of breath and was sent to the ER.  He is unsure if this is related to pain related to his right knee pain.  States that his symptoms have subsided.  Denies any chest pain.  No cardiac or pulmonary history that he is aware of.  Physical exam findings with tenderness of the right knee without any overlying skin changes.  Areas are vastly intact.  No signs of respiratory distress.  He appears uncomfortable with movement of his knee which is to be expected.  EKG here shows sinus rhythm, no ischemic changes, no STEMI.  CBC, BMP and troponin unremarkable x1.  He is low risk by heart score.  Chest x-ray without any acute findings.  He is PERC negative so I doubt his symptoms are due to PE.  No structural cause found on chest x-ray.  We will treat his rheumatoid arthritis flareup  with steroids and pain medication.  I suspect that the symptoms could be due to the pain that he was in.  I feel that he will benefit from continued rheumatology and orthopedics follow-up.  Patient is agreeable to the plan.  Return precautions given   Patient is hemodynamically stable, in NAD. Evaluation does not show pathology that would require ongoing emergent intervention or inpatient treatment. I explained the diagnosis to the patient. Pain has been managed and has no complaints prior to discharge. Patient is comfortable with above plan and is stable for discharge at this time. All questions were answered prior to disposition. Strict return precautions for returning to the ED were discussed. Encouraged follow up with PCP.   Prior to providing a prescription for a controlled substance, I independently reviewed the patient's recent prescription history on the [place] Controlled Substance Reporting System. The patient had no recent or regular prescriptions and was deemed appropriate for a brief, less than 3 day prescription of narcotic for acute analgesia.  An After Visit Summary was printed and given to the patient.   Portions of this note were generated with Scientist, clinical (histocompatibility and immunogenetics). Dictation errors may occur despite best attempts at proofreading.  Final Clinical Impression(s) / ED Diagnoses Final diagnoses:  Chest wall pain  Acute pain of right knee    Rx / DC Orders ED Discharge Orders         Ordered    HYDROcodone-acetaminophen (NORCO/VICODIN) 5-325 MG tablet  Every 6 hours PRN        07/13/20 1359    predniSONE (STERAPRED UNI-PAK 21 TAB) 10 MG (21) TBPK tablet  Daily        07/13/20 1359           Dietrich Pates, PA-C 07/13/20 1406    Lorre Nick, MD 07/13/20 1627

## 2020-07-13 NOTE — ED Triage Notes (Addendum)
Patient was seen at his orthopedic doctor today for his right knee.   Patient was diaphoretic, shob, and pale when he arrived to his orthopedist.   Patient reports chest pain and shortness of breath that started early today.   When EMS arrived patient was no longer diaphoretic or pale.   Hx: rheumatoid arthritis   A/Ox4  136/80 HR-90 98% RA

## 2020-07-30 ENCOUNTER — Encounter: Admit: 2020-07-30 | Discharge: 2020-07-30 | Payer: PRIVATE HEALTH INSURANCE | Attending: Family | Primary: Family

## 2020-07-30 ENCOUNTER — Ambulatory Visit: Admit: 2020-07-30 | Discharge: 2020-07-30 | Payer: PRIVATE HEALTH INSURANCE

## 2020-07-30 DIAGNOSIS — Z5181 Encounter for therapeutic drug level monitoring: Principal | ICD-10-CM

## 2020-07-30 DIAGNOSIS — H60391 Other infective otitis externa, right ear: Principal | ICD-10-CM

## 2020-07-30 DIAGNOSIS — Z79899 Other long term (current) drug therapy: Principal | ICD-10-CM

## 2020-07-30 DIAGNOSIS — L02211 Cutaneous abscess of abdominal wall: Principal | ICD-10-CM

## 2020-07-30 MED ORDER — NEOMYCIN-POLYMYXIN-HYDROCORT 3.5 MG-10,000 UNIT/ML-1 % EAR DROPS,SUSP
Freq: Four times a day (QID) | OTIC | 0 refills | 17 days | Status: CP
Start: 2020-07-30 — End: 2020-08-09

## 2020-07-30 MED ORDER — DOXYCYCLINE HYCLATE 100 MG CAPSULE
ORAL_CAPSULE | Freq: Two times a day (BID) | ORAL | 0 refills | 10.00000 days | Status: CP
Start: 2020-07-30 — End: 2020-08-09

## 2020-08-05 ENCOUNTER — Other Ambulatory Visit: Payer: Self-pay

## 2020-08-05 ENCOUNTER — Emergency Department (HOSPITAL_COMMUNITY): Payer: BC Managed Care – PPO

## 2020-08-05 ENCOUNTER — Encounter (HOSPITAL_COMMUNITY): Payer: Self-pay | Admitting: Emergency Medicine

## 2020-08-05 ENCOUNTER — Emergency Department (HOSPITAL_COMMUNITY)
Admission: EM | Admit: 2020-08-05 | Discharge: 2020-08-05 | Disposition: A | Discharge status: Home / Self Care | Payer: BC Managed Care – PPO | Attending: Emergency Medicine | Admitting: Emergency Medicine

## 2020-08-05 DIAGNOSIS — M25561 Pain in right knee: Secondary | ICD-10-CM | POA: Diagnosis present

## 2020-08-05 LAB — SYNOVIAL CELL COUNT + DIFF, W/ CRYSTALS
Crystals, Fluid: NONE SEEN
Lymphocytes-Synovial Fld: 4 % (ref 0–20)
Monocyte-Macrophage-Synovial Fluid: 6 % — ABNORMAL LOW (ref 50–90)
Neutrophil, Synovial: 90 % — ABNORMAL HIGH (ref 0–25)
WBC, Synovial: 12600 /mm3 — ABNORMAL HIGH (ref 0–200)

## 2020-08-05 LAB — CBC WITH DIFFERENTIAL/PLATELET
Abs Immature Granulocytes: 0.04 10*3/uL (ref 0.00–0.07)
Basophils Absolute: 0 10*3/uL (ref 0.0–0.1)
Basophils Relative: 0 %
Eosinophils Absolute: 0 10*3/uL (ref 0.0–0.5)
Eosinophils Relative: 0 %
HCT: 41.4 % (ref 39.0–52.0)
Hemoglobin: 14.2 g/dL (ref 13.0–17.0)
Immature Granulocytes: 1 %
Lymphocytes Relative: 5 %
Lymphs Abs: 0.5 10*3/uL — ABNORMAL LOW (ref 0.7–4.0)
MCH: 29.8 pg (ref 26.0–34.0)
MCHC: 34.3 g/dL (ref 30.0–36.0)
MCV: 87 fL (ref 80.0–100.0)
Monocytes Absolute: 0.5 10*3/uL (ref 0.1–1.0)
Monocytes Relative: 6 %
Neutro Abs: 7.7 10*3/uL (ref 1.7–7.7)
Neutrophils Relative %: 88 %
Platelets: 193 10*3/uL (ref 150–400)
RBC: 4.76 MIL/uL (ref 4.22–5.81)
RDW: 12.6 % (ref 11.5–15.5)
WBC: 8.7 10*3/uL (ref 4.0–10.5)
nRBC: 0 % (ref 0.0–0.2)

## 2020-08-05 LAB — BASIC METABOLIC PANEL
Anion gap: 14 (ref 5–15)
BUN: 10 mg/dL (ref 6–20)
CO2: 24 mmol/L (ref 22–32)
Calcium: 9.5 mg/dL (ref 8.9–10.3)
Chloride: 100 mmol/L (ref 98–111)
Creatinine, Ser: 0.96 mg/dL (ref 0.61–1.24)
GFR, Estimated: >60 mL/min (ref >60)
Glucose, Bld: 126 mg/dL — ABNORMAL HIGH (ref 70–99)
Potassium: 3.7 mmol/L (ref 3.5–5.1)
Sodium: 138 mmol/L (ref 135–145)

## 2020-08-05 MED ORDER — BUPIVACAINE HCL (PF) 0.5 % IJ SOLN
10.0000 mL | Freq: Once | INTRAMUSCULAR | Status: AC
  Administered 2020-08-05: 10 mL
  Filled 2020-08-05: qty 10

## 2020-08-05 MED ORDER — LIDOCAINE-EPINEPHRINE (PF) 2 %-1:200000 IJ SOLN
10.0000 mL | Freq: Once | INTRAMUSCULAR | Status: AC
  Administered 2020-08-05: 10 mL
  Filled 2020-08-05: qty 20

## 2020-08-05 MED ORDER — FENTANYL CITRATE (PF) 100 MCG/2ML IJ SOLN
50.0000 ug | Freq: Once | INTRAMUSCULAR | Status: AC
  Administered 2020-08-05: 50 ug via INTRAVENOUS

## 2020-08-05 MED ORDER — HYDROMORPHONE HCL 1 MG/ML IJ SOLN
1.0000 mg | Freq: Once | INTRAMUSCULAR | Status: AC
  Administered 2020-08-05: 1 mg via INTRAVENOUS
  Filled 2020-08-05: qty 1

## 2020-08-05 MED ORDER — KETOROLAC TROMETHAMINE 30 MG/ML IJ SOLN
30.0000 mg | Freq: Once | INTRAMUSCULAR | Status: AC
  Administered 2020-08-05: 30 mg via INTRAMUSCULAR
  Filled 2020-08-05: qty 1

## 2020-08-05 MED ORDER — FENTANYL CITRATE (PF) 100 MCG/2ML IJ SOLN
50.0000 ug | Freq: Once | INTRAMUSCULAR | Status: DC
Start: 2020-08-05 — End: 2020-08-05
  Filled 2020-08-05: qty 2

## 2020-08-05 MED ORDER — METHYLPREDNISOLONE ACETATE 40 MG/ML IJ SUSP
40.0000 mg | Freq: Once | INTRAMUSCULAR | Status: AC
  Administered 2020-08-05: 40 mg via INTRA_ARTICULAR
  Filled 2020-08-05: qty 1

## 2020-08-05 NOTE — Discharge Instructions (Addendum)
Please follow-up with rheumatology and orthopedics.  Your work-up today was consistent with an inflammatory arthritis does not indicate a septic arthritis.  This is reassuring.  You still have cultures from the fluid that was drained from your knee that are pending.  You will be contacted results of these if they are concerning for infection.  Cold compresses, continue take your anti-inflammatories per your rheumatologist.

## 2020-08-05 NOTE — ED Provider Notes (Signed)
[facility] EMERGENCY DEPARTMENT Provider Note   CSN: [id number] Arrival date & time: 08/05/20  0913     History Chief Complaint  Patient presents with   Knee Pain    Charles Schroeder is a 30 y.o. male.  HPI  Patient presents with right knee pain and swelling x3 days.  Started acutely, no trauma.  Painful with movement and touch.  States feeling swelling is gotten significantly worse over the past few days.  He is no longer able to ambulate due to pain.  States the pain will come up in the middle of the night yesterday.  Previous surgery to knee in 2010.  Has tried Tylenol without relief.  No fevers, chills.  No history of gout, but endorses drinking alcohol and having a high fat diet.  Does not smoke, no history of blood clots, no pain with claudication, no calf tenderness.  Patient also takes medicine for rheumatic arthritis.  States his arthritis usually presents with knee pain, jaw pain.  Past Medical History:  Diagnosis Date   Environmental allergies    Rheumatoid arthritis (HCC)     There are no problems to display for this patient.   Past Surgical History:  Procedure Laterality Date   KNEE SURGERY     MANDIBLE SURGERY     TYMPANOSTOMY TUBE PLACEMENT     WISDOM TOOTH EXTRACTION         No family history on file.  Social History   Tobacco Use   Smoking status: Never   Smokeless tobacco: Never  Substance Use Topics   Alcohol use: Yes    Comment: socially   Drug use: No    Home Medications Prior to Admission medications   Medication Sig Start Date End Date Taking? Authorizing Provider  diphenhydrAMINE (BENADRYL) 25 MG tablet Take 1 tablet (25 mg total) by mouth every 6 (six) hours as needed for itching (Rash). 11/02/13   Eber Hong, MD  EPINEPHrine 0.3 mg/0.3 mL IJ SOAJ injection Inject 0.3 mLs (0.3 mg total) into the muscle once. 11/02/13   Blane Ohara, MD  famotidine (PEPCID) 20 MG tablet Take 1 tablet (20 mg total) by mouth 2 (two)  times daily. 11/02/13   Eber Hong, MD  folic acid (FOLVITE) 1 MG tablet Take 3 mg by mouth daily.  10/28/13   [provider]  HUMIRA PEN 40 MG/0.8ML PNKT Inject 0.4 mLs into the skin every 14 (fourteen) days.  09/14/13   [provider]  HYDROcodone-acetaminophen (NORCO/VICODIN) 5-325 MG tablet Take 1 tablet by mouth every 6 (six) hours as needed. 07/13/20   Khatri, Hina, PA-C  methotrexate (RHEUMATREX) 5 MG tablet Take 25 mg by mouth once a week. Caution: Chemotherapy. Protect from light.    [provider]  predniSONE (STERAPRED UNI-PAK 21 TAB) 10 MG (21) TBPK tablet Take by mouth daily. Take 6 tabs by mouth daily  for 2 days, then 5 tabs for 2 days, then 4 tabs for 2 days, then 3 tabs for 2 days, 2 tabs for 2 days, then 1 tab by mouth daily for 2 days 07/13/20   Dietrich Pates, PA-C    Allergies    Bactrim [sulfamethoxazole-trimethoprim], Dog epithelium allergy skin test, Dust mite extract, Pollen extract, and Sulfa antibiotics  Review of Systems   Review of Systems  Constitutional:  Negative for fatigue and fever.  Musculoskeletal:  Positive for arthralgias, gait problem and joint swelling.  Skin:  Negative for color change.   Physical Exam Updated Vital  Signs BP 136/85   Pulse 82   Temp 97.8 F (36.6 C) (Oral)   Resp 16   SpO2 99%   Physical Exam Vitals and nursing note reviewed. Exam conducted with a chaperone present.  Constitutional:      General: He is not in acute distress.    Appearance: Normal appearance.  HENT:     Head: Normocephalic and atraumatic.  Eyes:     General: No scleral icterus.    Extraocular Movements: Extraocular movements intact.     Pupils: Pupils are equal, round, and reactive to light.  Musculoskeletal:        General: Swelling and tenderness present. No deformity or signs of injury.     Comments: ROM limited due to pain.  Patient unable to ambulate.  Suprapatellar swelling noted to right knee.  Previous surgical scar across  right knee linearly.  Severely tender to palpation.  Flexion and extension limited to 20 degrees.  Skin:    Coloration: Skin is not jaundiced.  Neurological:     Mental Status: He is alert. Mental status is at baseline.     Coordination: Coordination normal.    ED Results / Procedures / Treatments   Labs (all labs ordered are listed, but only abnormal results are displayed) Labs Reviewed - No data to display  EKG None  Radiology No results found.  Procedures Procedures   Medications Ordered in ED Medications  ketorolac (TORADOL) 30 MG/ML injection 30 mg (has no administration in time range)    ED Course  I have reviewed the triage vital signs and the nursing notes.  Pertinent labs & imaging results that were available during my care of the patient were reviewed by me and considered in my medical decision making (see chart for details).    MDM Rules/Calculators/A&P                          Patient is a 30 year old male with history of rheumatic arthritis presenting for right knee pain.  Vitals are stable, but physical exam very limited due to pain.  There is obvious swelling and tenderness to palpation.  I am somewhat concerned about a septic joint.  However, he is not having systemic symptoms such as fever or chills.  I will start with a radiograph of the knee.  We will also give pain medicine.  Differential also includes gout and arthritis.    Attending was unsuccessful with joint aspiration. Consult placed to Dale Salem with orthopedics who will see the patient.  Further disposition to follow.  Joint aspiration collected.  Awaiting results.  Further disposition pending joint aspiration fluid analysis.  Care transferred to [facility] at the end of shift.  Further disposition pending.   Discussed HPI, physical exam and plan of care for this patient with attending S. Goldston. The attending physician evaluated this patient as part of a shared visit and agrees with  plan of care.   Final Clinical Impression(s) / ED Diagnoses Final diagnoses:  None    Rx / DC Orders ED Discharge Orders     None        Theron Arista, PA-C 08/05/20 1528    Pricilla Loveless, MD 08/06/20 [phone]

## 2020-08-05 NOTE — ED Notes (Addendum)
Stoney Bang, PA at bedside. 13mL of fluid removed from knee

## 2020-08-05 NOTE — ED Notes (Signed)
Pt verbalized understanding of MSE

## 2020-08-05 NOTE — Procedures (Signed)
Procedure: Right knee aspiration and injection   Indication: Right knee effusion(s)   Surgeon: Charma Igo, PA-C   Assist: None   Anesthesia: Topical refrigerant   EBL: None   Complications: None   Findings: After risks/benefits explained patient desires to undergo procedure. Consent obtained and time out performed. The right knee was sterilely prepped and aspirated. 76ml turbid yellow fluid obtained with some bloody fluid the last 76ml or so. Pt tolerated the procedure well.       Freeman Caldron, PA-C Orthopedic Surgery [phone]

## 2020-08-05 NOTE — ED Provider Notes (Addendum)
Accepted handoff at shift change from [facility]. Please see prior provider note for more detail.   Briefly: Patient is 30 y.o.   DDX: concern for Septic arthritis vs inflammatory/osteo-arthritis   Plan: Follow-up on fluid cell counts.     Physical Exam  BP 127/82   Pulse 93   Temp 97.8 F (36.6 C) (Oral)   Resp 16   Ht 6\' 1"  (1.854 m)   Wt 110.2 kg   SpO2 99%   BMI 32.06 kg/m   Physical Exam  ED Course/Procedures     Procedures Results for orders placed or performed during the hospital encounter of 08/05/20  Body fluid culture w Gram Stain   Specimen: Body Fluid  Result Value Ref Range   Specimen Description FLUID RIGHT SYNOVIAL    Special Requests NONE    Gram Stain      MODERATE WBC PRESENT, PREDOMINANTLY PMN NO ORGANISMS SEEN Performed at [facility], [address], [place], [place]    Culture PENDING    Report Status PENDING   Basic metabolic panel  Result Value Ref Range   Sodium 138 135 - 145 mmol/L   Potassium 3.7 3.5 - 5.1 mmol/L   Chloride 100 98 - 111 mmol/L   CO2 24 22 - 32 mmol/L   Glucose, Bld 126 (H) 70 - 99 mg/dL   BUN 10 6 - 20 mg/dL   Creatinine, Ser 40102 0.61 - 1.24 mg/dL   Calcium 9.5 8.9 - 7.25 mg/dL   GFR, Estimated 36.6 >44 mL/min   Anion gap 14 5 - 15  CBC with Differential  Result Value Ref Range   WBC 8.7 4.0 - 10.5 K/uL   RBC 4.76 4.22 - 5.81 MIL/uL   Hemoglobin 14.2 13.0 - 17.0 g/dL   HCT >03 47.4 - 25.9 %   MCV 87.0 80.0 - 100.0 fL   MCH 29.8 26.0 - 34.0 pg   MCHC 34.3 30.0 - 36.0 g/dL   RDW 56.3 87.5 - 64.3 %   Platelets 193 150 - 400 K/uL   nRBC 0.0 0.0 - 0.2 %   Neutrophils Relative % 88 %   Neutro Abs 7.7 1.7 - 7.7 K/uL   Lymphocytes Relative 5 %   Lymphs Abs 0.5 (L) 0.7 - 4.0 K/uL   Monocytes Relative 6 %   Monocytes Absolute 0.5 0.1 - 1.0 K/uL   Eosinophils Relative 0 %   Eosinophils Absolute 0.0 0.0 - 0.5 K/uL   Basophils Relative 0 %   Basophils Absolute 0.0 0.0 - 0.1 K/uL   Immature  Granulocytes 1 %   Abs Immature Granulocytes 0.04 0.00 - 0.07 K/uL   DG Chest 2 View  Result Date: 07/13/2020 CLINICAL DATA:  Chest pain and shortness of breath EXAM: CHEST - 2 VIEW COMPARISON:  November 02, 2013 FINDINGS: Lungs are clear. Heart size and pulmonary vascularity are normal. No adenopathy. No pneumothorax. No bone lesions. IMPRESSION: Lungs clear.  Heart size normal. Electronically Signed   By: November 04, 2013 III M.D.   On: 07/13/2020 12:14   DG Knee Complete 4 Views Right  Result Date: 08/05/2020 CLINICAL DATA:  Right knee pain and swelling for 3 days. History of rheumatoid arthritis. EXAM: RIGHT KNEE - COMPLETE 4+ VIEW COMPARISON:  None. FINDINGS: No fracture or dislocation is identified. The patient has a moderate to moderately large knee joint effusion. Small osteophytes are present about the knee. Osseous structures along the anteromedial aspect of the distal femur are  consistent with myositis ossific cans or heterotopic ossification. IMPRESSION: Negative for acute bony abnormality. Myositis ossific cans or heterotopic ossification along the anteromedial distal femur are likely due to remote trauma. Moderate to moderately large knee joint effusion. Electronically Signed   By: Drusilla Kanner M.D.   On: 08/05/2020 10:30    MDM   Patient is 30 year old male history of septic arthritis.  Here today with right knee pain and swelling.  He was seen by [facility] and sent to ER for evaluation.  Knee aspiration was done by Earney Hamburg, PA.  Results of this are pending.  Multiple calls to lab were made because of delay.  Patient been very understanding and pleasant with this delay.  Synovial cell count with differential shows WBC less than 25,000 making septic arthritis very unlikely.  No prosthesis and this any further decreasing the concern for this.  Appropriate studies were ordered by prior provider.  These are pending.  Given reassuring labs will discharge home at this  time with rest ice elevation Tylenol ibuprofen close follow-up with [facility] and return precautions.  Patient understanding of plan.  Ace wrap placed on knee. Discussed with [place] prior to DC.        Gailen Shelter, [place] 08/06/20 1431    Gailen Shelter, [place] 08/06/20 1431    Benjiman Core, MD 08/06/20 1500

## 2020-08-05 NOTE — ED Notes (Signed)
Patient verbalized understanding of discharge instructions. Opportunity for questions and answers.  

## 2020-08-05 NOTE — Consult Note (Signed)
Reason for Consult:Right knee effusion Referring Physician: Pricilla Loveless Time called: 1119 Time at bedside: 1311   Charles Schroeder is an 30 y.o. male.  HPI: Charles Schroeder comes in with several days of right knee pain and swelling. He gets this periodically with his RA.He tried to get in with his rheumatologist but was unable and was directed to go to ED. He went to [facility] today but they did not think he would tolerate arthrocentesis there and sent him to ED. EDP attempted arthrocentesis but was unsuccessful and consulted orthopedic surgery. He denies fevers, chills, sweats, N/V.  Past Medical History:  Diagnosis Date   Environmental allergies    Rheumatoid arthritis (HCC)     Past Surgical History:  Procedure Laterality Date   KNEE SURGERY     MANDIBLE SURGERY     TYMPANOSTOMY TUBE PLACEMENT     WISDOM TOOTH EXTRACTION      No family history on file.  Social History:  reports that he has never smoked. He has never used smokeless tobacco. He reports current alcohol use. He reports that he does not use drugs.  Allergies:  Allergies  Allergen Reactions   Sulfa Antibiotics Swelling and Hives   Bactrim [Sulfamethoxazole-Trimethoprim] Hives and Itching   Dog Epithelium Allergy Skin Test Itching and Other (See Comments)    Congestion and sneezing   Dust Mite Extract Itching and Other (See Comments)    Congestion and sneezing    Bee Pollen Itching         Pollen Extract Itching and Other (See Comments)    Grasses; weeds- Congestion; sneezing; etc.     Medications: I have reviewed the patient's current medications.  Results for orders placed or performed during the hospital encounter of 08/05/20 (from the past 48 hour(s))  Basic metabolic panel     Status: Abnormal   Collection Time: 08/05/20 10:29 AM  Result Value Ref Range   Sodium 138 135 - 145 mmol/L   Potassium 3.7 3.5 - 5.1 mmol/L   Chloride 100 98 - 111 mmol/L   CO2 24 22 - 32 mmol/L   Glucose, Bld 126 (H) 70 - 99  mg/dL    Comment: Glucose reference range applies only to samples taken after fasting for at least 8 hours.   BUN 10 6 - 20 mg/dL   Creatinine, Ser 0.25 0.61 - 1.24 mg/dL   Calcium 9.5 8.9 - 85.2 mg/dL   GFR, Estimated >77 >82 mL/min    Comment: (NOTE) Calculated using the CKD-EPI Creatinine Equation (2021)    Anion gap 14 5 - 15    Comment: Performed at [facility], [address] [place], [place] [address]  CBC with Differential     Status: Abnormal   Collection Time: 08/05/20 10:29 AM  Result Value Ref Range   WBC 8.7 4.0 - 10.5 K/uL   RBC 4.76 4.22 - 5.81 MIL/uL   Hemoglobin 14.2 13.0 - 17.0 g/dL   HCT 61.4 43.1 - 54.0 %   MCV 87.0 80.0 - 100.0 fL   MCH 29.8 26.0 - 34.0 pg   MCHC 34.3 30.0 - 36.0 g/dL   RDW 08.6 76.1 - 95.0 %   Platelets 193 150 - 400 K/uL   nRBC 0.0 0.0 - 0.2 %   Neutrophils Relative % 88 %   Neutro Abs 7.7 1.7 - 7.7 K/uL   Lymphocytes Relative 5 %   Lymphs Abs 0.5 (L) 0.7 - 4.0 K/uL   Monocytes Relative 6 %  Monocytes Absolute 0.5 0.1 - 1.0 K/uL   Eosinophils Relative 0 %   Eosinophils Absolute 0.0 0.0 - 0.5 K/uL   Basophils Relative 0 %   Basophils Absolute 0.0 0.0 - 0.1 K/uL   Immature Granulocytes 1 %   Abs Immature Granulocytes 0.04 0.00 - 0.07 K/uL    Comment: Performed at [facility], [address], [place], [place] [address]    DG Knee Complete 4 Views Right  Result Date: 08/05/2020 CLINICAL DATA:  Right knee pain and swelling for 3 days. History of rheumatoid arthritis. EXAM: RIGHT KNEE - COMPLETE 4+ VIEW COMPARISON:  None. FINDINGS: No fracture or dislocation is identified. The patient has a moderate to moderately large knee joint effusion. Small osteophytes are present about the knee. Osseous structures along the anteromedial aspect of the distal femur are consistent with myositis ossific cans or heterotopic ossification. IMPRESSION: Negative for acute bony abnormality. Myositis ossific cans or heterotopic ossification along  the anteromedial distal femur are likely due to remote trauma. Moderate to moderately large knee joint effusion. Electronically Signed   By: Drusilla Kanner M.D.   On: 08/05/2020 10:30    Review of Systems  HENT:  Negative for ear discharge, ear pain, hearing loss and tinnitus.   Eyes:  Negative for photophobia and pain.  Respiratory:  Negative for cough and shortness of breath.   Cardiovascular:  Negative for chest pain.  Gastrointestinal:  Negative for abdominal pain, nausea and vomiting.  Genitourinary:  Negative for dysuria, flank pain, frequency and urgency.  Musculoskeletal:  Positive for arthralgias (Right knee) and joint swelling (Right knee). Negative for back pain, myalgias and neck pain.  Neurological:  Negative for dizziness and headaches.  Hematological:  Does not bruise/bleed easily.  Psychiatric/Behavioral:  The patient is not nervous/anxious.   Blood pressure (!) 132/91, pulse 91, temperature 97.8 F (36.6 C), temperature source Oral, resp. rate 18, height 6\' 1"  (1.854 m), weight 110.2 kg, SpO2 100 %. Physical Exam Constitutional:      General: He is not in acute distress.    Appearance: He is well-developed. He is not diaphoretic.  HENT:     Head: Normocephalic and atraumatic.  Eyes:     General: No scleral icterus.       Right eye: No discharge.        Left eye: No discharge.     Conjunctiva/sclera: Conjunctivae normal.  Cardiovascular:     Rate and Rhythm: Normal rate and regular rhythm.  Pulmonary:     Effort: Pulmonary effort is normal. No respiratory distress.  Musculoskeletal:     Cervical back: Normal range of motion.     Comments: RLE No traumatic wounds, ecchymosis, or rash  Mod TTP knee  Large knee effusion  Sens DPN, SPN, TN intact  Motor EHL, ext, flex, evers 5/5  DP 2+, No significant edema  Skin:    General: Skin is warm and dry.  Neurological:     Mental Status: He is alert.  Psychiatric:        Mood and Affect: Mood normal.         Behavior: Behavior normal.    Assessment/Plan: Right knee effusion -- Will re-attempt arthrocentsis.    , PA-C Orthopedic Surgery [phone] 08/05/2020, 2:40 PM

## 2020-08-05 NOTE — ED Triage Notes (Signed)
Pt reports R knee pain and swelling x 3 days.  History of rheumatoid arthritis.  Denies known injury.  States he had same a few weeks ago that resolved.

## 2020-08-05 NOTE — ED Notes (Signed)
Stoney Bang, PA at bedside

## 2020-08-05 NOTE — ED Notes (Signed)
Consent signed for second procedure. Jefferies PA informing pt. Consent At bedside.

## 2020-08-05 NOTE — ED Provider Notes (Signed)
Patient with recurrent right knee swelling/effusion.  Had this recently and it went away with steroids but then came back.  Sent from [facility] for arthrocentesis.  Knee is warm and swollen but not red and no fever.  After verbal consent arthrocentesis performed but unfortunately no fluid was able to be obtained.  This I have consulted orthopedics, Earney Hamburg.  .Joint Aspiration/Arthrocentesis  Date/Time: 08/05/2020 12:00 PM Performed by: Pricilla Loveless, MD Authorized by: Pricilla Loveless, MD   Consent:    Consent obtained:  Verbal and written   Consent given by:  Patient   Risks, benefits, and alternatives were discussed: yes     Risks discussed:  Bleeding, incomplete drainage, infection, nerve damage and pain Universal protocol:    Patient identity confirmed:  Verbally with patient Location:    Location:  Knee   Knee:  R knee Anesthesia:    Anesthesia method:  Local infiltration   Local anesthetic:  Lidocaine 2% WITH epi Procedure details:    Preparation: Patient was prepped and draped in usual sterile fashion     Needle gauge:  18 G   Approach:  Lateral   Aspirate amount:  0 Post-procedure details:    Procedure completion:  Tolerated with difficulty Comments:     No fluid obtained    Pricilla Loveless, MD 08/05/20 1201

## 2020-08-06 LAB — GLUCOSE, BODY FLUID OTHER: Glucose, Body Fluid Other: 2 mg/dL

## 2020-08-06 LAB — PROTEIN, BODY FLUID (OTHER): Total Protein, Body Fluid Other: 5.1 g/dL

## 2020-08-09 LAB — BODY FLUID CULTURE W GRAM STAIN: Culture: NO GROWTH

## 2020-08-09 MED ORDER — PREDNISONE 10 MG TABLET
ORAL_TABLET | Freq: Every day | ORAL | 0 refills | 7.00000 days | Status: CP
Start: 2020-08-09 — End: 2020-08-16

## 2020-08-10 DIAGNOSIS — M1380 Other specified arthritis, unspecified site: Principal | ICD-10-CM

## 2020-08-10 MED ORDER — PREDNISONE 5 MG TABLET
ORAL_TABLET | ORAL | 0 refills | 16.00000 days | Status: CP
Start: 2020-08-10 — End: 2020-08-26

## 2020-08-10 MED ORDER — METHOTREXATE SODIUM 2.5 MG TABLET
ORAL_TABLET | ORAL | 0 refills | 84 days | Status: CP
Start: 2020-08-10 — End: 2020-11-08

## 2020-08-21 ENCOUNTER — Encounter: Admit: 2020-08-21 | Discharge: 2020-08-21 | Payer: PRIVATE HEALTH INSURANCE

## 2020-08-21 ENCOUNTER — Encounter: Admit: 2020-08-21 | Discharge: 2020-08-21 | Payer: PRIVATE HEALTH INSURANCE | Attending: Family | Primary: Family

## 2020-08-21 DIAGNOSIS — Z5181 Encounter for therapeutic drug level monitoring: Principal | ICD-10-CM

## 2020-08-21 DIAGNOSIS — Z79899 Other long term (current) drug therapy: Principal | ICD-10-CM

## 2020-08-21 DIAGNOSIS — M25561 Pain in right knee: Principal | ICD-10-CM

## 2020-08-21 DIAGNOSIS — M1380 Other specified arthritis, unspecified site: Principal | ICD-10-CM

## 2020-08-21 DIAGNOSIS — E782 Mixed hyperlipidemia: Principal | ICD-10-CM

## 2020-08-21 MED ORDER — PREDNISONE 5 MG TABLET
ORAL_TABLET | ORAL | 0 refills | 28.00000 days | Status: CP
Start: 2020-08-21 — End: 2020-09-18

## 2020-08-21 MED ORDER — XELJANZ XR 11 MG TABLET,EXTENDED RELEASE
ORAL_TABLET | Freq: Every day | ORAL | 4 refills | 0.00000 days | Status: CP
Start: 2020-08-21 — End: 2021-08-21
  Filled 2020-09-01: qty 30, 30d supply, fill #0

## 2020-08-22 DIAGNOSIS — M1380 Other specified arthritis, unspecified site: Principal | ICD-10-CM

## 2020-08-28 NOTE — Unmapped (Signed)
[facility] Specialty Medication Onboarding    Specialty Medication: Rachel Bo 11mg  tabs  Prior Authorization: Approved   Financial Assistance: No - copay  <$25  Final Copay/Day Supply: $0 / 30 days    Insurance Restrictions: 1 month    Notes to Pharmacist:     The triage team has completed the benefits investigation and has determined that the patient is able to fill this medication at [facility]. Please contact the patient to complete the onboarding or follow up with the prescribing physician as needed.

## 2020-08-29 NOTE — Unmapped (Signed)
[facility]   Patient Onboarding/Medication Counseling    Mr.Marcello is a 30 y.o. male with Seronegative arthritis who I am counseling today on initiation of therapy.  I am speaking to the patient.    Was a Nurse, learning disability used for this call? No    Verified patient's date of birth / HIPAA.    Specialty medication(s) to be sent: Inflammatory Disorders: [name]      Non-specialty medications/supplies to be sent: none at this time      Medications not needed at this time: n/a         Xeljanz (tofacitinib)    Medication & Administration     Dosage: Arthritis: Take 11mg  by mouth once daily    Lab tests required prior to treatment initiation:  ??? Tuberculosis: Tuberculosis screening resulted in a non-reactive Quantiferon TB Gold assay. (07/18/19)  ??? Hepatitis B: Hepatitis B serology studies are complete and non-reactive. (03/17/12)      Administration:  Take tablets by mouth with or without food.  For extended release tablets - swallow intact, do not crush, split, or chew.    Adherence/Missed dose instructions:  Take a missed dose as soon as you remember it if it's been less than 6 hours (IR tabs) or 12 hours (XR tabs) from your normal dosing time.  Never take 2 doses to try and catch up from a missed dose.    Goals of Therapy       Arthritis  ??? Achieve symptom remission  ??? Slow disease progression  ??? Protection of remaining articular structures  ??? Maintenance of function  ??? Maintenance of effective psychosocial functioning        Side Effects & Monitoring Parameters     ??? Signs of a common cold - minor sore throat, runny or stuffy nose, etc.  ??? Headache  ??? Diarrhea    The following side effects should be reported to the provider:  ??? Signs of a hypersensitivity reaction - rash; hives; itching; red, swollen, blistered, or peeling skin; wheezing; tightness in the chest or throat; difficulty breathing, swallowing, or talking; swelling of the mouth, face, lips, tongue, or throat; etc.  ??? Reduced immune function - report signs of infection such as fever; chills; body aches; very bad sore throat; ear or sinus pain; cough; more sputum or change in color of sputum; pain with passing urine; wound that will not heal, etc.  Also at a slightly higher risk of some malignancies (mainly skin and blood cancers) due to this reduced immune function.  o In the case of signs of infection - the patient should hold the next dose of Xeljanz?? and call your primary care provider to ensure adequate medical care.  Treatment may be resumed when infection is treated and patient is asymptomatic.  ??? Changes in skin - a new growth or lump that forms; changes in shape, size, or color of a previous mole or marking  ??? Swelling or pain in your stomach that is very bad, gets worse, or does not go away  ??? Any signs of GI bleed - blood in vomit or stool  ??? Heartbeat that does not feel normal  ??? Shortness of breath      Contraindications, Warnings, & Precautions     ??? Dosage adjustment may be needed if used concomitantly with other CYP3A4 substrates  ??? Have your bloodwork checked as you have been told by your prescriber  ??? Talk with your doctor if you are pregnant, planning to  become pregnant, or breastfeeding  ??? Discuss the possible need for holding your dose(s) of [name]?? when a planned procedure is scheduled with the prescriber as it may delay healing/recovery timeline       Drug/Food Interactions     ??? Medication list reviewed in Epic. The patient was instructed to inform the care team before taking any new medications or supplements. No drug interactions identified.   ??? This medication can interact with grapefruit, star fruit, and [place] oranges  ??? Talk with you prescriber or pharmacist before receiving any live vaccinations while taking this medication and after you stop taking it      Storage, Handling Precautions, & Disposal     ??? Store in original container  ??? Store at room temperature  ??? Avoid moisture      Current Medications (including OTC/herbals), Comorbidities and Allergies     Current Outpatient Medications   Medication Sig Dispense Refill   ??? methotrexate 2.5 MG tablet Take 6 tablets (15 mg total) by mouth once a week. 72 tablet 0   ??? predniSONE (DELTASONE) 5 MG tablet Take 2 tablets (10 mg total) by mouth daily for 7 days, THEN 1.5 tablets (7.5 mg total) daily for 7 days, THEN 1 tablet (5 mg total) daily for 7 days, THEN 0.5 tablets (2.5 mg total) daily for 7 days. 35 tablet 0   ??? tocilizumab (ACTEMRA ACTPEN) 162 mg/0.9 mL PnIj Inject 162 mg under the skin every seven (7) days. (Patient not taking: Reported on 08/21/2020) 4 mL 5   ??? tocilizumab (ACTEMRA ACTPEN) 162 mg/0.9 mL PnIj Inject 162 mg under the skin.     ??? tofacitinib (XELJANZ XR) 11 mg Tb24 Take 1 tablet (11 mg) by mouth daily. 90 tablet 4     No current facility-administered medications for this visit.       Allergies   Allergen Reactions   ??? Sulfacetamide Sodium Swelling   ??? Sulfamethoxazole-Trimethoprim Hives and Itching   ??? Bee Pollen Itching     Other reaction(s): Other (See Comments)  Grasses; weeds;  Reaction: Congestion; sneezing; etc.   ??? Dog Epithelium Allergenic Extract Itching and Rash     Congestion; sneezing   ??? Mite Extract Itching     Other reaction(s): Other (See Comments)  Congestion; sneezing   ??? Pollens Extract Itching     Grasses; weeds;  Reaction: Congestion; sneezing; etc.       Patient Active Problem List   Diagnosis   ??? Seronegative arthritis   ??? Obesity   ??? Mild intermittent asthma without complication   ??? Dyspepsia   ??? GERD (gastroesophageal reflux disease)   ??? Hepatic steatosis   ??? Pain of both shoulder joints   ??? COVID-19 virus detected       Reviewed and up to date in Epic.    Appropriateness of Therapy     Acute infections noted within Epic:  No active infections  Patient reported infection: None    Is medication and dose appropriate based on diagnosis and infection status? Yes    Prescription has been clinically reviewed: Yes      Baseline Quality of Life Assessment      How many days over the past month did your Seronegative arthritis  keep you from your normal activities? For example, brushing your teeth or getting up in the morning. Patient declined to answer    Financial Information     Medication Assistance provided: Prior Authorization    Anticipated copay of $0 (  30 days) reviewed with patient. Verified delivery address.    Delivery Information     Scheduled delivery date: 09/02/20    Expected start date: 09/02/20    Medication will be delivered via UPS to the prescription address in [facility].  This shipment will not require a signature.      Explained the services we provide at [facility] and that each month we would call to set up refills.  Stressed importance of returning phone calls so that we could ensure they receive their medications in time each month.  Informed patient that we should be setting up refills 7-10 days prior to when they will run out of medication.  A pharmacist will reach out to perform a clinical assessment periodically.  Informed patient that a welcome packet, containing information about our pharmacy and other support services, a Notice of Privacy Practices, and a drug information handout will be sent.      The patient or caregiver noted above participated in the development of this care plan and knows that they can request review of or adjustments to the care plan at any time.      Patient or caregiver verbalized understanding of the above information as well as how to contact the pharmacy at [phone] option 4 with any questions/concerns.  The pharmacy is open Monday through Friday 8:30am-4:30pm.  A pharmacist is available 24/7 via pager to answer any clinical questions they may have.    Patient Specific Needs     - Does the patient have any physical, cognitive, or cultural barriers? No    - Does the patient have adequate living arrangements? (i.e. the ability to store and take their medication appropriately) Yes    - Did you identify any home environmental safety or security hazards? No    - Patient prefers to have medications discussed with  Patient     - Is the patient or caregiver able to read and understand education materials at a high school level or above? Yes    - Patient's primary language is  English     - Is the patient high risk? No    - Does the patient require physician intervention or other additional services (i.e. dietary/nutrition, smoking cessation, social work)? No      Camillo Flaming  [facility] Specialty Pharmacist

## 2020-09-25 MED ORDER — FOLIC ACID 1 MG TABLET
ORAL_TABLET | Freq: Every day | ORAL | 11 refills | 30.00000 days | Status: CP
Start: 2020-09-25 — End: 2021-09-25
  Filled 2020-09-29: qty 30, 30d supply, fill #0

## 2020-09-25 NOTE — Unmapped (Signed)
[facility] Clinical Assessment & Refill Coordination Note    Patient is taking methotrexate 2.5mg  6 tabs daily but is not taking any folic acid.  He does not remember the last time he took folic acid.  Patient reports feeling tired but not excessively.  If patient is to be on folic acid, he would like RX sent to [facility] and delivered with this [name] next week.  Per local [facility], they last filled Folic Acid in October of 2020 for patient.       James Singh, DOB: 06-Sep-1990  Phone: [phone] (home) [phone] (work)    All above HIPAA information was verified with patient.     Was a Nurse, learning disability used for this call? No    Specialty Medication(s):   Inflammatory Disorders: [name]     Current Outpatient Medications   Medication Sig Dispense Refill   ??? methotrexate 2.5 MG tablet Take 6 tablets (15 mg total) by mouth once a week. 72 tablet 0   ??? tocilizumab (ACTEMRA ACTPEN) 162 mg/0.9 mL PnIj Inject 162 mg under the skin every seven (7) days. (Patient not taking: Reported on 08/21/2020) 4 mL 5   ??? tocilizumab (ACTEMRA ACTPEN) 162 mg/0.9 mL PnIj Inject 162 mg under the skin. (Patient not taking: Reported on 09/25/2020)     ??? tofacitinib (XELJANZ XR) 11 mg Tb24 Take 1 tablet (11 mg) by mouth daily. 90 tablet 4     No current facility-administered medications for this visit.        Changes to medications: Elijiah reports no changes at this time.    Allergies   Allergen Reactions   ??? Sulfacetamide Sodium Swelling   ??? Sulfamethoxazole-Trimethoprim Hives and Itching   ??? Bee Pollen Itching     Other reaction(s): Other (See Comments)  Grasses; weeds;  Reaction: Congestion; sneezing; etc.   ??? Dog Epithelium Allergenic Extract Itching and Rash     Congestion; sneezing   ??? Mite Extract Itching     Other reaction(s): Other (See Comments)  Congestion; sneezing   ??? Pollens Extract Itching     Grasses; weeds;  Reaction: Congestion; sneezing; etc.       Changes to allergies: No    SPECIALTY MEDICATION ADHERENCE     Xeljanz XR 11 mg: 9 days of medicine on hand       Medication Adherence    Patient reported X missed doses in the last month: 0  Specialty Medication: [name] XR 11mg   Patient is on additional specialty medications: No  Informant: patient          Specialty medication(s) dose(s) confirmed: Regimen is correct and unchanged.     Are there any concerns with adherence? No    Adherence counseling provided? Not needed    CLINICAL MANAGEMENT AND INTERVENTION      Clinical Benefit Assessment:    Do you feel the medicine is effective or helping your condition? Yes--some benefit but it is still early    Clinical Benefit counseling provided? Reasonable expectations discussed: can work as early as 2 weeks or take up to 3 mos or longer    Adverse Effects Assessment:    Are you experiencing any side effects? No    Are you experiencing difficulty administering your medicine? No    Quality of Life Assessment:    Quality of Life    Rheumatology  1. What impact has your specialty medication had on the reduction of your daily pain level?: Some  2. What impact  has your specialty medication had on your ability to complete daily tasks (prepare meals, get dressed, etc...)?: Some  Oncology  Dermatology  Cystic Fibrosis              Have you discussed this with your provider? Not needed    Acute Infection Status:    Acute infections noted within Epic:  No active infections  Patient reported infection: None    Therapy Appropriateness:    Is therapy appropriate? Yes, therapy is appropriate and should be continued    DISEASE/MEDICATION-SPECIFIC INFORMATION      N/A    PATIENT SPECIFIC NEEDS     - Does the patient have any physical, cognitive, or cultural barriers? No    - Is the patient high risk? No    - Does the patient require a Care Management Plan? No     - Does the patient require physician intervention or other additional services (i.e. nutrition, smoking cessation, social work)? No      SHIPPING     Specialty Medication(s) to be Shipped:   Inflammatory Disorders: [name]    Other medication(s) to be shipped: No additional medications requested for fill at this time     Changes to insurance: No    Delivery Scheduled: Yes, Expected medication delivery date: 8/23.     Medication will be delivered via UPS to the confirmed prescription address in [facility].    The patient will receive a drug information handout for each medication shipped and additional FDA Medication Guides as required.  Verified that patient has previously received a Conservation officer, historic buildings and a Surveyor, mining.    The patient or caregiver noted above participated in the development of this care plan and knows that they can request review of or adjustments to the care plan at any time.      All of the patient's questions and concerns have been addressed.    Julianne Rice   [facility] Specialty Pharmacist

## 2020-09-29 MED FILL — XELJANZ XR 11 MG TABLET,EXTENDED RELEASE: ORAL | 30 days supply | Qty: 30 | Fill #1

## 2020-10-10 ENCOUNTER — Ambulatory Visit: Admit: 2020-10-10 | Discharge: 2020-10-11 | Payer: PRIVATE HEALTH INSURANCE | Attending: Family | Primary: Family

## 2020-10-10 DIAGNOSIS — L700 Acne vulgaris: Principal | ICD-10-CM

## 2020-10-10 MED ORDER — DOXYCYCLINE HYCLATE 100 MG CAPSULE
ORAL_CAPSULE | Freq: Two times a day (BID) | ORAL | 0 refills | 10 days | Status: CP
Start: 2020-10-10 — End: ?

## 2020-10-10 NOTE — Unmapped (Unsigned)
Assessment and Plan:     There are no diagnoses linked to this encounter.    I personally spent *** minutes face-to-face and non-face-to-face in the care of this patient, which includes all pre, intra, and post visit time on the date of service.    No follow-ups on file.    HPI:      James Singh  is here for   Chief Complaint   Patient presents with   ??? Acne       Patient presents for facial breakout, onset 2 weeks ago.   It became severe on Sunday.     Benzoyl peroxide topical     Patient has history of cystic acne in high school and was treated with accutane.   {kerichronicdx:74702}    Discontinued Actemra  Began Xeljanz 2 months  Ago.   Next rheumatology       {keriacutedx:74703}     ROS:      Comprehensive 10 point ROS negative unless otherwise stated in the HPI.      [facility] Components:     Medication adherence and barriers to the treatment plan have been addressed. Opportunities to optimize healthy behaviors have been discussed. Patient / caregiver voiced understanding.    Past Medical/Surgical History:     Past Medical History:   Diagnosis Date   ??? Arthritis     RA   ??? Asthma    ??? Obesity      Past Surgical History:   Procedure Laterality Date   ??? JAW BONE [facility] HISTORICAL RESULT)     ??? KNEE ARTHROPLASTY Right    ??? NECK INJECTION      STERLID - L4 OR L5   ??? PR UPPER GI ENDOSCOPY,BIOPSY N/A 11/09/2018    Procedure: UGI ENDOSCOPY; WITH BIOPSY, SINGLE OR MULTIPLE;  Surgeon: Bronson Curb, MD;  Location: [facility];  Service: Gastroenterology       Family History:     Family History   Problem Relation Age of Onset   ??? Neuropathy Mother         damaged facial nerve   ??? Hypertension Mother    ??? No Known Problems Father    ??? Mental illness Brother    ??? Heart disease Maternal Aunt    ??? Aortic aneurysm Maternal Aunt         abdominal   ??? Heart disease Maternal Uncle    ??? Aortic aneurysm Maternal Uncle         abdominal   ??? Heart disease Maternal Grandmother    ??? Hyperlipidemia Maternal Location: [facility] [place];  Service: Gastroenterology       Family History:     Family History   Problem Relation Age of Onset   ??? Neuropathy Mother         damaged facial nerve   ??? Hypertension Mother    ??? No Known Problems Father    ??? Mental illness Brother    ??? Heart disease Maternal Aunt    ??? Aortic aneurysm Maternal Aunt         abdominal   ??? Heart disease Maternal Uncle    ??? Aortic aneurysm Maternal Uncle         abdominal   ??? Heart disease Maternal Grandmother    ??? Hyperlipidemia Maternal Grandmother    ??? Hypertension Maternal Grandmother    ??? Stroke Maternal Grandmother    ??? Aortic aneurysm Maternal Grandmother  abdominal   ??? Heart disease Maternal Grandfather    ??? Hyperlipidemia Maternal Grandfather    ??? Hypertension Maternal Grandfather    ??? Stroke Paternal Grandfather    ??? Substance Abuse Disorder Neg Hx        Social History:     Social History     Tobacco Use   ??? Smoking status: Never Smoker   ??? Smokeless tobacco: Never Used   Vaping Use   ??? Vaping Use: Never used   Substance Use Topics   ??? Alcohol use: Yes     Alcohol/week: 4.0 standard drinks     Types: 4 Shots of liquor per week     Comment: 2 pints a month   ??? Drug use: No       Allergies:     Sulfacetamide sodium, Sulfamethoxazole-trimethoprim, Bee pollen, Dog epithelium allergenic extract, Mite extract, and Pollens extract    Current Medications:     Current Outpatient Medications   Medication Sig Dispense Refill   ??? folic acid (FOLVITE) 1 MG tablet Take 1 tablet (1 mg total) by mouth daily. 30 tablet 11   ??? methotrexate 2.5 MG tablet Take 6 tablets (15 mg total) by mouth once a week. 72 tablet 0   ??? tofacitinib (XELJANZ XR) 11 mg Tb24 Take 1 tablet (11 mg) by mouth daily. 90 tablet 4   ??? tocilizumab (ACTEMRA ACTPEN) 162 mg/0.9 mL PnIj Inject 162 mg under the skin every seven (7) days. (Patient not taking: No sig reported) 4 mL 5   ??? tocilizumab (ACTEMRA ACTPEN) 162 mg/0.9 mL PnIj Inject 162 mg under the skin. (Patient not VACC,MRNA,(PFIZER)(PF)(IM) 04/15/2019, 05/08/2019, 10/08/2019   ??? DTaP, Unspecified Formulation 09/28/1988, 11/29/1988, 02/18/1989, 02/23/1990, 09/01/1992   ??? Hepatitis B Vaccine, Unspecified Formulation 07/22/1995, 08/31/1995, 06/08/1996   ??? HiB, unspecified 02/18/1989, 07/14/1989, 10/28/1989   ??? Influenza Vaccine Quad (IIV4 PF) 30mo+ injectable 01/28/2014, 11/21/2014, 03/08/2016, 01/03/2017, 12/05/2017, 10/21/2018   ??? Influenza Vaccine Quad (IIV4 W/PRESERV) 22MO+ 11/09/2018   ??? Influenza Virus Vaccine, unspecified formulation 12/09/2005, 11/22/2006, 10/21/2018, 11/06/2019   ??? MMR 10/28/1989, 09/01/1992   ??? Meningococcal ACWY, Unspecified Formulation 12/09/2005   ??? PNEUMOCOCCAL POLYSACCHARIDE 23 03/31/2015   ??? Pneumococcal Conjugate 13-Valent 10/21/2014   ??? Pneumococcal, Unspecified Formulation 12/09/2005   ??? Polio Virus Vaccine, Unspecified Formulation 09/28/1988, 11/29/1988, 02/23/1990, 09/01/1992   ??? TdaP 06/24/2010, 02/09/2011   ??? Tetanus-Diptheria Toxoids-TD(TDVAX),Asdorbed,2LF(IM) 10/04/2002     I have reviewed and (if needed) updated the patient's problem list, medications, allergies, past medical and surgical history, social and family history.     Vital Signs:     Wt Readings from Last 3 Encounters:   10/10/20 (!) 111.1 kg (245 lb)   08/21/20 (!) 105.2 kg (232 lb)   07/30/20 (!) 110.7 kg (244 lb)     Temp Readings from Last 3 Encounters:   10/10/20 37.5 ??C (99.5 ??F)   08/21/20 35.8 ??C (96.4 ??F)   07/30/20 36.1 ??C (97 ??F) (Temporal)     BP Readings from Last 3 Encounters:   10/10/20 118/80   08/21/20 143/88   07/30/20 120/86     Pulse Readings from Last 3 Encounters:   10/10/20 92   08/21/20 92   07/30/20 76     Estimated body mass index is 32.32 kg/m?? as calculated from the following:    Height as of this encounter: 185.4 cm (6' 1).    Weight as of this encounter: 111.1 kg (245 lb).  Facility age limit for growth  percentiles is 20 years.      Objective:      General: Alert and oriented x3. Well-appearing. No acute distress. ***  HEENT:  Normocephalic.  Atraumatic. Conjunctiva and sclera normal. OP MMM without lesions. ***  Neck:  Supple. No thyroid enlargement. No adenopathy. ***  Heart:  Regular rate and rhythm. Normal S1, S2. No murmurs, rubs or gallops. ***  Lungs:  No respiratory distress.  Lungs clear to auscultation. No wheezes, rhonchi, or rales. ***  GI/GU:  Soft, +BS, nondistended, non-TTP. No palpable masses or organomegaly. ***  Extremities:  No edema. Peripheral pulses normal. ***  Skin:  Warm, dry. No rash or lesions present. ***  Neuro:  Non-focal. No obvious weakness. ***  Psych:  Affect normal, eye contact good, speech clear and coherent. ***

## 2020-10-16 NOTE — Unmapped (Signed)
[facility] Refill Coordination Note    Specialty Medication(s) to be Shipped:   Inflammatory Disorders: James Singh    Other medication(s) to be shipped: folic acid     James Singh, DOB: October 15, 1990  Phone: [phone] (home) [phone] (work)      All above HIPAA information was verified with patient.     Was a Nurse, learning disability used for this call? No    Completed refill call assessment today to schedule patient's medication shipment from the [facility] [phone]).  All relevant notes have been reviewed.     Specialty medication(s) and dose(s) confirmed: Regimen is correct and unchanged.   Changes to medications: James Singh reports no changes at this time.  Changes to insurance: No  New side effects reported not previously addressed with a pharmacist or physician: None reported  Questions for the pharmacist: No    Confirmed patient received a James Singh and a [facility] with first shipment. The patient will receive a drug information handout for each medication shipped and additional FDA Medication Guides as required.       DISEASE/MEDICATION-SPECIFIC INFORMATION        N/A    SPECIALTY MEDICATION ADHERENCE     Medication Adherence    Patient reported X missed doses in the last month: 0  Specialty Medication: James Singh  Patient is on additional specialty medications: No  Patient is on more than two specialty medications: No  Any gaps in refill history greater than 2 weeks in the last 3 months: no  Demonstrates understanding of importance of adherence: yes  Informant: patient              Were doses missed due to medication being on hold? No    Xeljanz 11mg : Patient has 14 days of medication on hand     REFERRAL TO PHARMACIST     Referral to the pharmacist: Not needed      James Singh     Shipping address confirmed in Epic.     Delivery Scheduled: Yes, Expected medication delivery date: 9/20.     Medication will be delivered via [facility] to the prescription address in Epic [facility].    [facility]   [facility] Specialty Technician

## 2020-10-20 NOTE — Unmapped (Signed)
Rheumatology Clinic Follow-up Note    Assessment/Plan:   James Singh is a 30 y.o.  male with a past medical history of seronegative peripheral inflammatory arthritis (hips, knees, jaw), disc disease of the cervical spine, and pontine cavernous malformation who presents today for follow-up.  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 He had been controlled on Humira, and then had a flare while on Actemra in July 2022. He is currently on MTX 15mg  weekly and Xeljanz 11mg  daily. He has an ongoing mild limp with mild warmth in the left knee without effusion, and slightly decreased ROM in the left hip. We discussed continuing this current therapy since [name] was started about 6-7 weeks ago, vs changing to Enbrel after discussion with Thuy in pharmacy who thinks a non-monoclonal antiTNF is a reasonable option. He preferred to continue current therapy as his prior flare has mostly resolved. He declines a prednisone taper due to cystic acne.   ??  Seronegative peripheral inflammatory arthritis:   - Continue MTX 15mg  PO weekly with daily 1mg  folic acid.   -  Continue Xeljanz XR 11mg  PO daily   - If symptoms worsen recommend trial of Enbrel - discussed risks with Thuy in pharmacy and she does not think there would be an increased risk of LCV on this.   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  Health maintenance:          - PCV 13: 10/21/14          - PCV 23: 03/31/15      - Annual flu vaccination - received for 2021      - Received both COVID vaccines and booster in 2021, recommend updated COVID booster     Follow-up in 3 months with Delorise Shiner and 6 months with Dr. Jerre Simon, MD  [facility]   Pager [phone]  10/21/2020    Primary Care Provider: Noralyn Pick, FNP    HPI:  James Singh is a 30 y.o. Caucasian male with a past medical history of pontine cavernous malformation (dx 04/2018), C spine disc disease, and seronegative inflammatory arthritis who presents for follow-up.    As background,  Mr. Groeneveld developed inflammatory arthritis of the R knee and left hip in late 2013.  He was placed on steroids, with some improvement.  However, he flared during tapers and was subsequently placed on methotrexate.  Due to persistent hip pain despite MTX, he was started on Humira in June 2014.   The patient has a history of terminal ileal ulcer in 2013 and has followed with GI in the past. Biopsy was consistent with acute inflammation, not IBD.   In regard to his seronegative arthritis, he is on Humira 40mg  SQ q2 week, MTX 20mg  SQ qweek, and daily folic. He flared in 2018 when trying to taper MTX.  MTX was decreased to 15mg  weekly in Sept 2020 due to LFT elevated thought to be related to NASH and Tylenol use. Humira was stopped in Nov 2021 when he developed cutaneous leukocytoclastic vasculitis.  Actemra was started in January 2022 as an alternative biologic therapy. He had a flare in July  2022 with R knee & L hip involvement and was switched to [place].     Interval Events:   He was last seen by Delorise Shiner in July due to R knee swelling and recurrent L hip pain while on Actemra. He received a R knee steroid injection and completed about a month long Prednisone taper. This caused bad cystic acne.  He was switched from Actemra to [place] in late July 2022 and remains on MTX 15mg  weekly. He notes very mild R knee swelling/stiffness, much improved since summer, and non-bothersome L hip stiffness. He continues to have a limp though.     Review of Systems:  + as above, otherwise all other systems reviewed are negative.     Medical History:    Past Medical History:   Diagnosis Date   ??? Arthritis     RA   ??? Asthma ??? Obesity      Allergies:  Sulfacetamide sodium, Sulfamethoxazole-trimethoprim, Bee pollen, Dog epithelium allergenic extract, Mite extract, and Pollens extract    Medications:     Current Outpatient Medications:   ???  doxycycline (VIBRAMYCIN) 100 MG capsule, Take 1 capsule (100 mg total) by mouth Two (2) times a day., Disp: 20 capsule, Rfl: 0  ???  folic acid (FOLVITE) 1 MG tablet, Take 1 tablet (1 mg total) by mouth daily., Disp: 30 tablet, Rfl: 11  ???  tofacitinib (XELJANZ XR) 11 mg Tb24, Take 1 tablet (11 mg) by mouth daily., Disp: 90 tablet, Rfl: 4  ???  methotrexate 2.5 MG tablet, Take 6 tablets (15 mg total) by mouth once a week., Disp: 72 tablet, Rfl: 0    Surgical History:Reviewed in Epic   Social History: Never smoker  Family History: Reviewed in Epic     Objective   Vitals:    10/21/20 1444   BP: 119/82   BP Site: L Arm   BP Position: Sitting   BP Cuff Size: Large   Pulse: 85   Resp: 20   Temp: 36.2 ??C (97.1 ??F)   TempSrc: Temporal   Weight: (!) 110.5 kg (243 lb 9.6 oz)   Height: 185.4 cm (6' 1)       Physical Exam:   General: Well appearing, no acute distress  Skin: No petechial rash, cystic acne on face.   Musculoskeletal:   No swelling or tenderness of the hands, wrists, elbows.  Normal range of motion of the shoulders.  Right knee is status post prior surgery, mildly warm to touch but no overt effusion and normal ROM. No left knee swelling or tenderness.  Normal ROM of the R hip, slightly decreased ROM in external rotation of the L hip.   No ankle swelling.   Neurologic: Alert and conversing normally.  5/5 strength throughout. Slight limp while walking in the hall.     Labs:     Lab Results   Component Value Date    WBC 9.7 08/21/2020    HGB 16.4 08/21/2020    HCT 45.4 08/21/2020    PLT 222 08/21/2020     Lab Results   Component Value Date    NA 138 07/30/2020    K 4.2 07/30/2020    CL 101 07/30/2020    CO2 27.8 07/30/2020    BUN 15 07/30/2020    CREATININE 0.90 08/21/2020    GLU 92 07/30/2020 CALCIUM 9.9 07/30/2020    MG 1.9 04/21/2018    PHOS 4.4 01/13/2012     Lab Results   Component Value Date  BILITOT 1.5 (H) 07/30/2020    PROT 7.5 07/30/2020    ALBUMIN 4.2 07/30/2020    ALT 34 08/21/2020    AST 24 08/21/2020    ALKPHOS 89 07/30/2020     Joint aspirate R knee 08/2020 - 35K WBC, 73% PMNs, negative crystals and bacteria     Imaging:   R knee xray 06/2019:   Femorotibial osteophytosis, progressed since prior study.  Heterotopic ossification about the distal femur.    L Hip Xray 08/2016:  Severe left axial hip joint space narrowing, which can be seen with inflammatory arthropathy, increased since the prior study. No erosive change.    MRI S spine 01/2019:  Subarticular extrusion at left C5-C6 ventrally indenting the left cervical cord  and impinging the left sided exiting C6 nerve root.  C6-7 left lateral protrusion vs uncovertebral hypertrophy with resultant mild foraminal stenosis.

## 2020-10-21 ENCOUNTER — Ambulatory Visit: Admit: 2020-10-21 | Discharge: 2020-10-22 | Payer: PRIVATE HEALTH INSURANCE

## 2020-10-21 DIAGNOSIS — M25559 Pain in unspecified hip: Principal | ICD-10-CM

## 2020-10-21 DIAGNOSIS — Z5181 Encounter for therapeutic drug level monitoring: Principal | ICD-10-CM

## 2020-10-21 DIAGNOSIS — Z79899 Other long term (current) drug therapy: Principal | ICD-10-CM

## 2020-10-21 DIAGNOSIS — M25569 Pain in unspecified knee: Principal | ICD-10-CM

## 2020-10-21 DIAGNOSIS — M1380 Other specified arthritis, unspecified site: Principal | ICD-10-CM

## 2020-10-21 LAB — COMPREHENSIVE METABOLIC PANEL
ALBUMIN: 4.5 g/dL (ref 3.4–5.0)
ALKALINE PHOSPHATASE: 80 U/L (ref 46–116)
ALT (SGPT): 36 U/L (ref 10–49)
ANION GAP: 5 mmol/L (ref 5–14)
AST (SGOT): 36 U/L — ABNORMAL HIGH (ref <=34)
BILIRUBIN TOTAL: 1.1 mg/dL (ref 0.3–1.2)
BLOOD UREA NITROGEN: 15 mg/dL (ref 9–23)
BUN / CREAT RATIO: 15
CALCIUM: 10.1 mg/dL (ref 8.7–10.4)
CHLORIDE: 106 mmol/L (ref 98–107)
CO2: 27.3 mmol/L (ref 20.0–31.0)
CREATININE: 1.01 mg/dL
EGFR CKD-EPI (2021) MALE: >90 mL/min/{1.73_m2} (ref >=60)
GLUCOSE RANDOM: 87 mg/dL (ref 70–179)
POTASSIUM: 4.2 mmol/L (ref 3.4–4.8)
PROTEIN TOTAL: 7.7 g/dL (ref 5.7–8.2)
SODIUM: 138 mmol/L (ref 135–145)

## 2020-10-21 LAB — CBC W/ AUTO DIFF
BASOPHILS ABSOLUTE COUNT: 0 10*9/L (ref 0.0–0.1)
BASOPHILS RELATIVE PERCENT: 0.6 %
EOSINOPHILS ABSOLUTE COUNT: 0.1 10*9/L (ref 0.0–0.5)
EOSINOPHILS RELATIVE PERCENT: 1.2 %
HEMATOCRIT: 41.1 % (ref 39.0–48.0)
HEMOGLOBIN: 14.5 g/dL (ref 12.9–16.5)
LYMPHOCYTES ABSOLUTE COUNT: 1.7 10*9/L (ref 1.1–3.6)
LYMPHOCYTES RELATIVE PERCENT: 27.3 %
MEAN CORPUSCULAR HEMOGLOBIN CONC: 35.3 g/dL (ref 32.0–36.0)
MEAN CORPUSCULAR HEMOGLOBIN: 31.2 pg (ref 25.9–32.4)
MEAN CORPUSCULAR VOLUME: 88.3 fL (ref 77.6–95.7)
MEAN PLATELET VOLUME: 7.7 fL (ref 6.8–10.7)
MONOCYTES ABSOLUTE COUNT: 0.5 10*9/L (ref 0.3–0.8)
MONOCYTES RELATIVE PERCENT: 9 %
NEUTROPHILS ABSOLUTE COUNT: 3.7 10*9/L (ref 1.8–7.8)
NEUTROPHILS RELATIVE PERCENT: 61.9 %
NUCLEATED RED BLOOD CELLS: 0 /100{WBCs} (ref <=4)
PLATELET COUNT: 249 10*9/L (ref 150–450)
RED BLOOD CELL COUNT: 4.66 10*12/L (ref 4.26–5.60)
RED CELL DISTRIBUTION WIDTH: 16.1 % — ABNORMAL HIGH (ref 12.2–15.2)
WBC ADJUSTED: 6 10*9/L (ref 3.6–11.2)

## 2020-10-21 MED ORDER — METHOTREXATE SODIUM 2.5 MG TABLET
ORAL_TABLET | ORAL | 0 refills | 84.00000 days | Status: CP
Start: 2020-10-21 — End: 2021-01-19

## 2020-10-21 NOTE — Unmapped (Signed)
Continue MTX and Xeljanz.     Try physical therapy for the limp.     If your knee swelling a lot please let us know and we can try another steroid injection in the knee.

## 2020-10-28 MED FILL — FOLIC ACID 1 MG TABLET: ORAL | 30 days supply | Qty: 30 | Fill #1

## 2020-10-28 MED FILL — XELJANZ XR 11 MG TABLET,EXTENDED RELEASE: ORAL | 30 days supply | Qty: 30 | Fill #2

## 2020-11-11 NOTE — Unmapped (Signed)
[facility] Refill Coordination Note    Specialty Medication(s) to be Shipped:   Inflammatory Disorders: James Singh    Other medication(s) to be shipped: folic acid     James Singh, DOB: 1990/11/10  Phone: [phone] (home) [phone] (work)      All above HIPAA information was verified with patient.     Was a Nurse, learning disability used for this call? No    Completed refill call assessment today to schedule patient's medication shipment from the [facility] [phone]).  All relevant notes have been reviewed.     Specialty medication(s) and dose(s) confirmed: Regimen is correct and unchanged.   Changes to medications: James Singh reports no changes at this time.  Changes to insurance: No  New side effects reported not previously addressed with a pharmacist or physician: None reported  Questions for the pharmacist: No    Confirmed patient received a Conservation officer, historic buildings and a Surveyor, mining with first shipment. The patient will receive a drug information handout for each medication shipped and additional FDA Medication Guides as required.       DISEASE/MEDICATION-SPECIFIC INFORMATION        N/A    SPECIALTY MEDICATION ADHERENCE     Medication Adherence    Patient reported X missed doses in the last month: 0  Specialty Medication: James Singh  Patient is on additional specialty medications: No  Patient is on more than two specialty medications: No  Any gaps in refill history greater than 2 weeks in the last 3 months: no  Demonstrates understanding of importance of adherence: yes  Informant: patient              Were doses missed due to medication being on hold? No    Xeljanz 11mg : Patient has 14 days of medication on hand    REFERRAL TO PHARMACIST     Referral to the pharmacist: Not needed      [facility]     Shipping address confirmed in Epic.     Delivery Scheduled: Yes, Expected medication delivery date: 10/17.     Medication will be delivered via UPS to the prescription address in Epic WAM.    James Singh   [facility] Specialty Technician

## 2020-11-21 MED FILL — FOLIC ACID 1 MG TABLET: ORAL | 30 days supply | Qty: 30 | Fill #2

## 2020-11-21 NOTE — Unmapped (Signed)
RENDER James Singh 's [name] shipment will be delayed as a result of the medication is too soon to refill until 11/23/20.     I have reached out to the patient  at [phone] and sent a delay text and left a voicemail message.  We will wait for a call back from the patient to reschedule the delivery.  We have not confirmed the new delivery date.

## 2020-11-24 NOTE — Unmapped (Signed)
Patient called back in and we have confirmed the new delivery date as 10/19 via UPS.

## 2020-11-25 MED FILL — XELJANZ XR 11 MG TABLET,EXTENDED RELEASE: ORAL | 30 days supply | Qty: 30 | Fill #3

## 2020-12-18 NOTE — Unmapped (Signed)
[facility] Refill Coordination Note    Specialty Medication(s) to be Shipped:   Inflammatory Disorders: James Singh    Other medication(s) to be shipped: Folic Acid     James Singh, DOB: 15-Jul-1990  Phone: [phone] (home) [phone] (work)      All above HIPAA information was verified with patient.     Was a Nurse, learning disability used for this call? No    Completed refill call assessment today to schedule patient's medication shipment from the [facility] [phone]).  All relevant notes have been reviewed.     Specialty medication(s) and dose(s) confirmed: Regimen is correct and unchanged.   Changes to medications: [name] reports no changes at this time.  Changes to insurance: No  New side effects reported not previously addressed with a pharmacist or physician: None reported  Questions for the pharmacist: No    Confirmed patient received a Conservation officer, historic buildings and a Surveyor, mining with first shipment. The patient will receive a drug information handout for each medication shipped and additional FDA Medication Guides as required.       DISEASE/MEDICATION-SPECIFIC INFORMATION        N/A    SPECIALTY MEDICATION ADHERENCE     Medication Adherence    Patient reported X missed doses in the last month: 0  Specialty Medication: James Singh  Patient is on additional specialty medications: No  Patient is on more than two specialty medications: No  Any gaps in refill history greater than 2 weeks in the last 3 months: no  Demonstrates understanding of importance of adherence: yes  Informant: patient              Were doses missed due to medication being on hold? No    Xeljanz 11mg : Patient has 10 days of medication on hand    REFERRAL TO PHARMACIST     Referral to the pharmacist: Not needed      [facility]     Shipping address confirmed in Epic.     Delivery Scheduled: Yes, Expected medication delivery date: 11/16.     Medication will be delivered via UPS to the prescription address in Epic WAM.    James Singh   [facility] Specialty Singh

## 2020-12-23 MED FILL — XELJANZ XR 11 MG TABLET,EXTENDED RELEASE: ORAL | 30 days supply | Qty: 30 | Fill #4

## 2020-12-23 MED FILL — FOLIC ACID 1 MG TABLET: ORAL | 30 days supply | Qty: 30 | Fill #3

## 2020-12-30 DIAGNOSIS — L700 Acne vulgaris: Principal | ICD-10-CM

## 2021-01-21 NOTE — Unmapped (Signed)
[facility] Refill Coordination Note    Specialty Medication(s) to be Shipped:   Inflammatory Disorders: James Singh    Other medication(s) to be shipped: folic acid     James Singh, DOB: 1990-10-23  Phone: [phone] (home) [phone] (work)      All above HIPAA information was verified with patient.     Was a Nurse, learning disability used for this call? No    Completed refill call assessment today to schedule patient's medication shipment from the [facility] [phone]).  All relevant notes have been reviewed.     Specialty medication(s) and dose(s) confirmed: Regimen is correct and unchanged.   Changes to medications: Keyler reports no changes at this time.  Changes to insurance: No  New side effects reported not previously addressed with a pharmacist or physician: None reported  Questions for the pharmacist: No    Confirmed patient received a Conservation officer, historic buildings and a Surveyor, mining with first shipment. The patient will receive a drug information handout for each medication shipped and additional FDA Medication Guides as required.       DISEASE/MEDICATION-SPECIFIC INFORMATION        N/A    SPECIALTY MEDICATION ADHERENCE     Medication Adherence    Patient reported X missed doses in the last month: 0  Specialty Medication: James Singh  Patient is on additional specialty medications: No  Patient is on more than two specialty medications: No  Any gaps in refill history greater than 2 weeks in the last 3 months: no  Demonstrates understanding of importance of adherence: yes  Informant: patient              Were doses missed due to medication being on hold? No    Xeljanz 11mg : Patient has 10 days of medication on hand    REFERRAL TO PHARMACIST     Referral to the pharmacist: Not needed      [facility]     Shipping address confirmed in Epic.     Delivery Scheduled: Yes, Expected medication delivery date: 12/21.     Medication will be delivered via UPS to the prescription address in Epic WAM.    [name]   [facility] Specialty Technician

## 2021-01-27 MED FILL — XELJANZ XR 11 MG TABLET,EXTENDED RELEASE: ORAL | 30 days supply | Qty: 30 | Fill #5

## 2021-01-27 MED FILL — FOLIC ACID 1 MG TABLET: ORAL | 30 days supply | Qty: 30 | Fill #4

## 2021-02-17 NOTE — Unmapped (Signed)
[facility] Clinical Assessment & Refill Coordination Note    James Singh, DOB: 1990-05-21  Phone: [phone] (home) [phone] (work)    All above HIPAA information was verified with patient.     Was a Nurse, learning disability used for this call? No    Specialty Medication(s):   Inflammatory Disorders: [name]     Current Outpatient Medications   Medication Sig Dispense Refill   ??? doxycycline (VIBRAMYCIN) 100 MG capsule Take 1 capsule (100 mg total) by mouth Two (2) times a day. 20 capsule 0   ??? folic acid (FOLVITE) 1 MG tablet Take 1 tablet (1 mg total) by mouth daily. 30 tablet 11   ??? tofacitinib (XELJANZ XR) 11 mg Tb24 Take 1 tablet (11 mg) by mouth daily. 90 tablet 4     No current facility-administered medications for this visit.        Changes to medications: Not showing up on this med list, but patient is taking MTX filling at [facility]     Allergies   Allergen Reactions   ??? Sulfacetamide Sodium Swelling   ??? Sulfamethoxazole-Trimethoprim Hives and Itching   ??? Bee Pollen Itching     Other reaction(s): Other (See Comments)  Grasses; weeds;  Reaction: Congestion; sneezing; etc.   ??? Dog Epithelium Allergenic Extract Itching and Rash     Congestion; sneezing   ??? Mite Extract Itching     Other reaction(s): Other (See Comments)  Congestion; sneezing   ??? Pollens Extract Itching     Grasses; weeds;  Reaction: Congestion; sneezing; etc.       Changes to allergies: No    SPECIALTY MEDICATION ADHERENCE     Xeljanz  XR 11 mg: 10 days of medicine on hand       Medication Adherence    Patient reported X missed doses in the last month: 0  Specialty Medication: Xeljanx  Patient is on additional specialty medications: No  Informant: patient          Specialty medication(s) dose(s) confirmed: Regimen is correct and unchanged.     Are there any concerns with adherence? No    Adherence counseling provided? Not needed    CLINICAL MANAGEMENT AND INTERVENTION      Clinical Benefit Assessment:    Do you feel the medicine is effective or helping your condition? Yes    Clinical Benefit counseling provided? Progress note from 9/13 shows evidence of clinical benefit    Adverse Effects Assessment:    Are you experiencing any side effects? No    Are you experiencing difficulty administering your medicine? No    Quality of Life Assessment:    Quality of Life    Rheumatology  Oncology  Dermatology  Cystic Fibrosis          How many days over the past month did your condition  keep you from your normal activities? For example, brushing your teeth or getting up in the morning. Patient declined to answer    Have you discussed this with your provider? Not needed    Acute Infection Status:    Acute infections noted within Epic:  No active infections  Patient reported infection: None    Therapy Appropriateness:    Is therapy appropriate and patient progressing towards therapeutic goals? Yes, therapy is appropriate and should be continued    DISEASE/MEDICATION-SPECIFIC INFORMATION      N/A    PATIENT SPECIFIC NEEDS     - Does the patient have any physical, cognitive,  or cultural barriers? No    - Is the patient high risk? No    - Does the patient require a Care Management Plan? No     SOCIAL DETERMINANTS OF HEALTH     At the [facility], we have learned that life circumstances - like trouble affording food, housing, utilities, or transportation can affect the health of many of our patients.   That is why we wanted to ask: are you currently experiencing any life circumstances that are negatively impacting your health and/or quality of life? No    Social Determinants of Health     Food Insecurity: Not on file   Tobacco Use: Low Risk    ??? Smoking Tobacco Use: Never   ??? Smokeless Tobacco Use: Never   ??? Passive Exposure: Not on file   Transportation Needs: No Transportation Needs   ??? Lack of Transportation (Medical): No   ??? Lack of Transportation (Non-Medical): No   Alcohol Use: Not At Risk   ??? How often do you have a drink containing alcohol?: 2 - 4 times per month   ??? How many drinks containing alcohol do you have on a typical day when you are drinking?: 1 - 2   ??? How often do you have 5 or more drinks on one occasion?: Never   Housing/Utilities: Not on file   Substance Use: Not on file   Financial Resource Strain: Not on file   Physical Activity: Not on file   Health Literacy: Not on file   Stress: Not on file   Intimate Partner Violence: Not on file   Depression: At risk   ??? PHQ-2 Score: 4   Social Connections: Not on file       Would you be willing to receive help with any of the needs that you have identified today? No       SHIPPING     Specialty Medication(s) to be Shipped:   Inflammatory Disorders: [name]    Other medication(s) to be shipped: No additional medications requested for fill at this time     Changes to insurance: No    Delivery Scheduled: Yes, Expected medication delivery date: 1/16 or 1/17 .     Medication will be delivered via UPS to the confirmed prescription address in [facility].    The patient will receive a drug information handout for each medication shipped and additional FDA Medication Guides as required.  Verified that patient has previously received a Conservation officer, historic buildings and a Surveyor, mining.    The patient or caregiver noted above participated in the development of this care plan and knows that they can request review of or adjustments to the care plan at any time.      All of the patient's questions and concerns have been addressed.    Julianne Rice   [facility] Specialty Pharmacist

## 2021-02-18 NOTE — Unmapped (Signed)
Rheumatology Clinic Follow-up Note    Assessment/Plan:   James Singh is a 31 y.o.  male with a past medical history of seronegative peripheral inflammatory arthritis (hips, knees, jaw), disc disease of the cervical spine, and pontine cavernous malformation who presents today for follow-up.  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 He had been controlled on Humira, and then had a flare while on Actemra in July 2022. He is currently on MTX 15mg  weekly and Xeljanz 11mg  daily. Today he's reporting new intermittent pain to R foot but unclear if inflammatory.   ??  Seronegative peripheral inflammatory arthritis:   - Continue MTX 15mg  PO weekly with daily 1mg  folic acid. Refilled today.  - Continue Xeljanz XR 11mg  PO daily   - Check CRP and sed rate today  - If symptoms worsen recommend trial of Enbrel - discussed risks with Thuy in pharmacy and she does not think there would be an increased risk of LCV on this.   - Update B/L foot xrays today. If overall normal will consider referral to [place] to eval for synovitis.   - Continue Tylenol 1000mg  BID PRN for joint pain.     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  Health maintenance:          - PCV 13: 10/21/14          - PCV 23: 03/31/15      - Annual flu vaccination - 12/2020      - Received COVID booster- 12/2020    Follow-up as scheduled in April with James Singh    I personally spent 34 minutes face-to-face and non-face-to-face in the care of this patient, which includes all pre, intra, and post visit time on the date of service.  All documented time was specific to the E/M visit and does not include any procedures that may have been performed.    Primary Care Provider: Noralyn Pick, FNP    HPI:  James Singh is a 31 y.o. Caucasian male with a past medical history of pontine cavernous malformation (dx 04/2018), C spine disc disease, and seronegative inflammatory arthritis who presents for follow-up.    As background,  James Singh developed inflammatory arthritis of the R knee and left hip in late 2013.  He was placed on steroids, with some improvement.  However, he flared during tapers and was subsequently placed on methotrexate.  Due to persistent hip pain despite MTX, he was started on Humira in June 2014.   The patient has a history of terminal ileal ulcer in 2013 and has followed with GI in the past. Biopsy was consistent with acute inflammation, not IBD.   In regard to his seronegative arthritis, he is on Humira 40mg  SQ q2 week, MTX 20mg  SQ qweek, and daily folic. He flared in 2018 when trying to taper MTX.  MTX was decreased to 15mg  weekly in Sept 2020 due to LFT elevated thought to be related to NASH and Tylenol use. Humira was stopped in Nov 2021 when he developed cutaneous leukocytoclastic vasculitis.  Actemra was started in January 2022 as an alternative biologic therapy. He had a flare in July 2022 with R knee & L hip involvement and was switched to [place].  Last Visit: 10/21/20 with Dr. Marshall Singh.     Interval Events:   Pt presents for f/u today. He reports his hips and knees feel good but recently he has noticed on and off pain to R foot and L ankle. He will notice R foot pain then L ankle pain will develop due to correcting for R foot pain. He denies any swelling to feet or ankles. He notices the pain more with use. He denies much stiffness.     He is taking MTX 6 tablets once a week, along with FA daily. He also is taking [place] daily without missed doses. He's tolerating this regimen well, no side effects.     He did PT for his L knee and this helped his mobility. He denies recent fever, illness or infections.    Review of Systems:  + as above, otherwise all other systems reviewed are negative.     Medical History:    Past Medical History:   Diagnosis Date   ??? Arthritis     RA   ??? Asthma    ??? Obesity      Allergies:  Sulfa (sulfonamide antibiotics), Sulfacetamide sodium, Sulfamethoxazole-trimethoprim, Bee pollen, Dog dander, Dog epithelium allergenic extract, Mite extract, and Pollens extract    Medications:     Current Outpatient Medications:   ???  doxycycline (VIBRAMYCIN) 100 MG capsule, Take 1 capsule (100 mg total) by mouth Two (2) times a day., Disp: 20 capsule, Rfl: 0  ???  folic acid (FOLVITE) 1 MG tablet, Take 1 tablet (1 mg total) by mouth daily., Disp: 30 tablet, Rfl: 11  ???  methotrexate 2.5 MG tablet, Take 10 mg by mouth., Disp: , Rfl:   ???  tofacitinib (XELJANZ XR) 11 mg Tb24, Take 1 tablet (11 mg) by mouth daily., Disp: 90 tablet, Rfl: 4    Surgical History:Reviewed in Epic   Social History: Never smoker  Family History: Reviewed in Epic     Objective   Vitals:    02/20/21 1436   BP: 119/86   Pulse: 89   Temp: 36.3 ??C (97.3 ??F)   TempSrc: Temporal   Weight: (!) 109.4 kg (241 lb 3.2 oz)       Physical Exam:   General: Well appearing, no acute distress  Skin: No petechial rash, cystic acne on face.   Musculoskeletal:   No swelling or tenderness of the hands, wrists, elbows.  Normal range of motion of the shoulders.  Right knee is status post prior surgery, mildly warm to touch but no overt effusion and normal ROM. No left knee swelling or tenderness.  Normal ROM of the R hip, slightly decreased ROM in external rotation of the L hip.   No ankle swelling. Mild tenderness to L anterior ankle.   Pain with R MTP squeeze, no overt swelling. No pain with L MTP squeeze.   Neurologic: Alert and conversing normally.  5/5 strength throughout. Slight limp while walking in the hall.     Labs:     Lab Results   Component Value Date    WBC 6.0 10/21/2020    HGB 14.5 10/21/2020    HCT 41.1 10/21/2020    PLT 249 10/21/2020     Lab Results   Component Value Date    NA 138 10/21/2020    K 4.2 10/21/2020 CL 106 10/21/2020    CO2 27.3 10/21/2020    BUN 15 10/21/2020    CREATININE 1.01 10/21/2020    GLU 87 10/21/2020  CALCIUM 10.1 10/21/2020    MG 1.9 04/21/2018    PHOS 4.4 01/13/2012     Lab Results   Component Value Date    BILITOT 1.1 10/21/2020    PROT 7.7 10/21/2020    ALBUMIN 4.5 10/21/2020    ALT 36 10/21/2020    AST 36 (H) 10/21/2020    ALKPHOS 80 10/21/2020     Joint aspirate R knee 08/2020 - 35K WBC, 73% PMNs, negative crystals and bacteria     Imaging:   R knee xray 06/2019:   Femorotibial osteophytosis, progressed since prior study.  Heterotopic ossification about the distal femur.    L Hip Xray 08/2016:  Severe left axial hip joint space narrowing, which can be seen with inflammatory arthropathy, increased since the prior study. No erosive change.    MRI S spine 01/2019:  Subarticular extrusion at left C5-C6 ventrally indenting the left cervical cord  and impinging the left sided exiting C6 nerve root.  C6-7 left lateral protrusion vs uncovertebral hypertrophy with resultant mild foraminal stenosis.

## 2021-02-20 ENCOUNTER — Ambulatory Visit: Admit: 2021-02-20 | Discharge: 2021-02-20 | Payer: PRIVATE HEALTH INSURANCE | Attending: Family | Primary: Family

## 2021-02-20 ENCOUNTER — Ambulatory Visit: Admit: 2021-02-20 | Discharge: 2021-02-20 | Payer: PRIVATE HEALTH INSURANCE

## 2021-02-20 DIAGNOSIS — M1380 Other specified arthritis, unspecified site: Principal | ICD-10-CM

## 2021-02-20 DIAGNOSIS — Z5181 Encounter for therapeutic drug level monitoring: Principal | ICD-10-CM

## 2021-02-20 DIAGNOSIS — Z79899 Other long term (current) drug therapy: Principal | ICD-10-CM

## 2021-02-20 LAB — CBC W/ AUTO DIFF
BASOPHILS ABSOLUTE COUNT: 0 10*9/L (ref 0.0–0.1)
BASOPHILS RELATIVE PERCENT: 0.5 %
EOSINOPHILS ABSOLUTE COUNT: 0.1 10*9/L (ref 0.0–0.5)
EOSINOPHILS RELATIVE PERCENT: 1.6 %
HEMATOCRIT: 42.4 % (ref 39.0–48.0)
HEMOGLOBIN: 15.1 g/dL (ref 12.9–16.5)
LYMPHOCYTES ABSOLUTE COUNT: 0.9 10*9/L — ABNORMAL LOW (ref 1.1–3.6)
LYMPHOCYTES RELATIVE PERCENT: 15.6 %
MEAN CORPUSCULAR HEMOGLOBIN CONC: 35.6 g/dL (ref 32.0–36.0)
MEAN CORPUSCULAR HEMOGLOBIN: 31.3 pg (ref 25.9–32.4)
MEAN CORPUSCULAR VOLUME: 87.9 fL (ref 77.6–95.7)
MEAN PLATELET VOLUME: 8.5 fL (ref 6.8–10.7)
MONOCYTES ABSOLUTE COUNT: 0.6 10*9/L (ref 0.3–0.8)
MONOCYTES RELATIVE PERCENT: 10.1 %
NEUTROPHILS ABSOLUTE COUNT: 4.4 10*9/L (ref 1.8–7.8)
NEUTROPHILS RELATIVE PERCENT: 72.2 %
PLATELET COUNT: 204 10*9/L (ref 150–450)
RED BLOOD CELL COUNT: 4.82 10*12/L (ref 4.26–5.60)
RED CELL DISTRIBUTION WIDTH: 14.2 % (ref 12.2–15.2)
WBC ADJUSTED: 6 10*9/L (ref 3.6–11.2)

## 2021-02-20 LAB — C-REACTIVE PROTEIN: C-REACTIVE PROTEIN: 23 mg/L — ABNORMAL HIGH (ref <=10.0)

## 2021-02-20 LAB — AST: AST (SGOT): 25 U/L (ref <=34)

## 2021-02-20 LAB — SEDIMENTATION RATE: ERYTHROCYTE SEDIMENTATION RATE: 28 mm/h — ABNORMAL HIGH (ref 0–15)

## 2021-02-20 LAB — CREATININE
CREATININE: 1 mg/dL
EGFR CKD-EPI (2021) MALE: >90 mL/min/{1.73_m2} (ref >=60)

## 2021-02-20 LAB — ALT: ALT (SGPT): 35 U/L (ref 10–49)

## 2021-02-20 MED ORDER — METHOTREXATE SODIUM 2.5 MG TABLET
ORAL_TABLET | ORAL | 0 refills | 84 days | Status: CP
Start: 2021-02-20 — End: 2021-05-21

## 2021-02-20 NOTE — Unmapped (Signed)
LABAN [facility]KE 's Rachel Bo 11mg  tabs shipment will be delayed as a result of the medication is too soon to refill until 1/14.     I have reached out to the patient  at [phone] and left a voicemail message.  We will wait for a call back from the patient to reschedule the delivery.  We have not confirmed the new delivery date.

## 2021-02-24 DIAGNOSIS — M1380 Other specified arthritis, unspecified site: Principal | ICD-10-CM

## 2021-02-27 MED FILL — FOLIC ACID 1 MG TABLET: ORAL | 30 days supply | Qty: 30 | Fill #5

## 2021-02-27 MED FILL — XELJANZ XR 11 MG TABLET,EXTENDED RELEASE: ORAL | 30 days supply | Qty: 30 | Fill #6

## 2021-03-20 ENCOUNTER — Ambulatory Visit: Admit: 2021-03-20 | Discharge: 2021-03-20 | Payer: PRIVATE HEALTH INSURANCE | Attending: Family | Primary: Family

## 2021-03-20 DIAGNOSIS — Z131 Encounter for screening for diabetes mellitus: Principal | ICD-10-CM

## 2021-03-20 DIAGNOSIS — Z0000 Encounter for general adult medical examination without abnormal findings: Principal | ICD-10-CM

## 2021-03-20 DIAGNOSIS — E785 Hyperlipidemia, unspecified: Principal | ICD-10-CM

## 2021-03-20 LAB — HEMOGLOBIN A1C
ESTIMATED AVERAGE GLUCOSE: 100 mg/dL
HEMOGLOBIN A1C: 5.1 % (ref 4.8–5.6)

## 2021-03-20 LAB — LIPID PANEL
CHOLESTEROL/HDL RATIO SCREEN: 5.5 — ABNORMAL HIGH (ref 1.0–4.5)
CHOLESTEROL: 241 mg/dL — ABNORMAL HIGH (ref <=200)
HDL CHOLESTEROL: 44 mg/dL (ref 40–60)
LDL CHOLESTEROL CALCULATED: 180 mg/dL — ABNORMAL HIGH (ref 40–99)
NON-HDL CHOLESTEROL: 197 mg/dL — ABNORMAL HIGH (ref 70–130)
TRIGLYCERIDES: 84 mg/dL (ref 0–150)
VLDL CHOLESTEROL CAL: 16.8 mg/dL (ref 10–50)

## 2021-03-20 NOTE — Unmapped (Addendum)
To find local therapists you may go to: [email or web address]  You can then filter results by insurance type using the dropdown menu at the top of the page.

## 2021-03-20 NOTE — Unmapped (Signed)
Assessment and Plan:     James Singh was seen today for annual exam.    Diagnoses and all orders for this visit:    Annual physical exam  Discussed healthy behaviors including well balanced diet and regular exercise.  ?? Can try over the counter gas relief medication  ?? Recommended Debrox drops and antihistamines for muffled hearing, contact me if symptoms worsen or persist  ?? Provided resources for finding a local therapist      Hyperlipidemia, unspecified hyperlipidemia type  -     Lipid Panel    Screening for diabetes mellitus  -     Hemoglobin A1c    Other orders  -     Tdap vaccine greater than or equal to 7yo IM (Adacel, Decavax)       Barriers to goals identified and addressed. Pertinent handouts were given today and reviewed with the patient as indicated.  The Care Plan and Self-Management goals have been included on the AVS and the AVS has been printed. I encouraged the patient to keep regular logs for me to review at their next visit. Any outside resources or referrals needed at this time are noted above. Patient's current medications have been reviewed. Any new medications prescribed have been discussed, and side effects have been addressed.  Have assessed the patient's understanding, response, and barriers to adherence to medications. Patient voiced understanding and all questions have been answered to satisfaction.     Return in about 6 months (around 09/17/2021) for Next scheduled follow up.    Subjective:      James Singh is a 31 y.o. male being seen for a comprehensive physical exam.        Chief Complaint   Patient presents with   ??? Annual Exam     Fasting.       Lifestyle:  Employment: [employer]  Exercise: strength training or water aerobics 30 min 2-3x weekly   Diet: generally well-balanced, drinks mostly soda but trying to add more water  Caffeine Use: tea in the morning, 3-4 cans of soda daily   Last Dentist: 11/2020  Last Vision: 04/2020, scheduled 04/2021    HPI:  No significant changes. Followed by rheum every 3 months for arthritis; he is doing well after switching from Humira to [place]. He takes Tums as needed for heartburn, acetaminophen for occasional headaches. He has had some muffled hearing, R>L. He denies any recent cold symptoms but does have seasonal allergies. He notes some general anxiety about life. His coping strategies include deep breathing and listening to music. He has done counseling for this in the past but did not find this very helpful, although he would be open to trying to find a better fit in the future.       PHQ-9 Score: 10  Screening complete, depression identified / today's follow-up action documented in note    ASCVD risk:  The ASCVD Risk score (Arnett DK, et al., 2019) failed to calculate for the following reasons:    The 2019 ASCVD risk score is only valid for ages 10 to 54    Note: For patients with SBP <90 or >200, Total Cholesterol <130 or >320, HDL <20 or >100 which are outside of the allowable range, the calculator will use these upper or lower values to calculate the patient???s risk score.         ROS:     General: no fatigue, excess weight loss or gain, overall feels well  ENT: denies throat symptoms,  nasal congestion. Positive for some R>L hearing difficulty.   Cardiovascular: denies chest pain, palpitations, tachycardia  Respiratory: denies, dyspnea, dyspnea on exertion, orthopnea, wheezing, cough  Gastrointestinal: denies nausea, vomiting. No chronic constipation or diarrhea, melena, hematochezia. Positive for some postprandial acid reflux, eructation.   Genitourinary: denies urinary difficulties, erectile dysfunction  Musculoskeletal: denies significant muscle pain or weakness. Positive for arthritis per baseline.   Integumentary: denies rashes  or other skin problems  Neurological: denies dizziness, numbness, tingling, syncope. Positive for occasional headaches.   Psychological: denies symptoms suggesting depression, sleep disturbance. Positive for anxiety. Social History:     Social History     Tobacco Use   ??? Smoking status: Never   ??? Smokeless tobacco: Never   Vaping Use   ??? Vaping Use: Never used   Substance Use Topics   ??? Alcohol use: Yes     Alcohol/week: 4.0 standard drinks     Types: 4 Shots of liquor per week     Comment: 1-2 drinks 2-4 times a month   ??? Drug use: No       Past Medical/Surgical History:     Past Medical History:   Diagnosis Date   ??? Arthritis     RA   ??? Asthma    ??? Obesity      Past Surgical History:   Procedure Laterality Date   ??? JAW BONE [facility] HISTORICAL RESULT)     ??? KNEE ARTHROPLASTY Right    ??? NECK INJECTION      STERLID - L4 OR L5   ??? PR UPPER GI ENDOSCOPY,BIOPSY N/A 11/09/2018    Procedure: UGI ENDOSCOPY; WITH BIOPSY, SINGLE OR MULTIPLE;  Surgeon: Bronson Curb, MD;  Location[facility] PROC[facility];  Service: Gastroenterology       Family History:     Family History   Problem Relation Age of Onset   ??? Neuropathy Mother         damaged facial nerve   ??? Hypertension Mother    ??? No Known Problems Father    ??? Mental illness Brother    ??? Heart disease Maternal Aunt    ??? Aortic aneurysm Maternal Aunt         abdominal   ??? Heart disease Maternal Uncle    ??? Aortic aneurysm Maternal Uncle         abdominal   ??? Heart disease Maternal Grandmother    ??? Hyperlipidemia Maternal Grandmother    ??? Hypertension Maternal Grandmother    ??? Stroke Maternal Grandmother    ??? Aortic aneurysm Maternal Grandmother         abdominal   ??? Heart disease Maternal Grandfather    ??? Hyperlipidemia Maternal Grandfather    ??? Hypertension Maternal Grandfather    ??? Stroke Paternal Grandfather    ??? Substance Abuse Disorder Neg Hx        Allergies:     Sulfa (sulfonamide antibiotics), Sulfacetamide sodium, Sulfamethoxazole-trimethoprim, Bee pollen, Dog dander, Dog epithelium allergenic extract, Mite extract, and Pollens extract    Current Medications:     Current Outpatient Medications   Medication Sig Dispense Refill   ??? doxycycline (VIBRAMYCIN) 100 MG capsule Take 1 capsule (100 mg total) by mouth Two (2) times a day. 20 capsule 0   ??? folic acid (FOLVITE) 1 MG tablet Take 1 tablet (1 mg total) by mouth daily. 30 tablet 11   ??? methotrexate 2.5 MG tablet Take 6 tablets (15 mg total) by mouth once  a week. 72 tablet 0   ??? tofacitinib (XELJANZ XR) 11 mg Tb24 Take 1 tablet (11 mg) by mouth daily. 90 tablet 4     No current facility-administered medications for this visit.       Health Maintenance:     Health Maintenance   Topic Date Due   ??? DTaP/Tdap/Td Vaccines (10 - Td or Tdap) 03/21/2031   ??? Pneumococcal Vaccine 0-64 (3 - PPSV23 if available, else PCV20) 06/30/2053   ??? Hepatitis C Screen  Completed   ??? COVID-19 Vaccine  Completed   ??? Influenza Vaccine  Completed       Immunizations:     Immunization History   Administered Date(s) Administered   ??? COVID-19 VAC,BIVALENT(39YR UP)BOOST,PFIZER 11/14/2020   ??? COVID-19 VACC,MRNA,(PFIZER)(PF) 04/15/2019, 05/08/2019, 10/08/2019   ??? DTaP, Unspecified Formulation 09/28/1988, 11/29/1988, 02/18/1989, 02/23/1990, 09/01/1992   ??? Hepatitis B Vaccine, Unspecified Formulation 07/22/1995, 08/31/1995, 06/08/1996   ??? HiB, unspecified 02/18/1989, 07/14/1989, 10/28/1989   ??? Influenza Vaccine Quad (IIV4 PF) 58mo+ injectable 01/28/2014, 11/21/2014, 03/08/2016, 01/03/2017, 12/05/2017, 10/21/2018   ??? Influenza Vaccine Quad (IIV4 W/PRESERV) 102MO+ 11/09/2018   ??? Influenza Virus Vaccine, unspecified formulation 12/09/2005, 11/22/2006, 10/21/2018, 11/06/2019, 11/14/2020   ??? MMR 10/28/1989, 09/01/1992   ??? Meningococcal ACWY, Unspecified Formulation 12/09/2005   ??? PNEUMOCOCCAL POLYSACCHARIDE 23 03/31/2015   ??? Pneumococcal Conjugate 13-Valent 10/21/2014   ??? Pneumococcal, Unspecified Formulation 12/09/2005   ??? Polio Virus Vaccine, Unspecified Formulation 09/28/1988, 11/29/1988, 02/23/1990, 09/01/1992   ??? TdaP 06/24/2010, 02/09/2011, 03/20/2021   ??? Tetanus-Diptheria Toxoids-TD(TDVAX),Asdorbed,2LF(IM) 10/04/2002     I have reviewed and (if needed) updated the patient's problem list, medications, allergies, past medical and surgical history, social and family history.  Vital Signs:     Wt Readings from Last 3 Encounters:   03/20/21 (!) 111.6 kg (246 lb)   02/20/21 (!) 109.4 kg (241 lb 3.2 oz)   10/21/20 (!) 110.5 kg (243 lb 9.6 oz)     Temp Readings from Last 3 Encounters:   03/20/21 37.1 ??C (98.8 ??F) (Oral)   02/20/21 36.3 ??C (97.3 ??F) (Temporal)   10/21/20 36.2 ??C (97.1 ??F) (Temporal)     BP Readings from Last 3 Encounters:   03/20/21 122/72   02/20/21 119/86   10/21/20 119/82     Pulse Readings from Last 3 Encounters:   03/20/21 72   02/20/21 89   10/21/20 85     Estimated body mass index is 32.22 kg/m?? as calculated from the following:    Height as of this encounter: 186.1 cm (6' 1.27).    Weight as of this encounter: 111.6 kg (246 lb).  Facility age limit for growth percentiles is 20 years.        Objective:      General Appearance: Alert, cooperative, no distress, appears stated age.  EYES: PERRL, conjunctiva/corneas clear, EOM's intact, fundi  benign, both eyes  ENT:  L EAC clear, L TM pearly grey with normal light reflex. R EAC with moderate cerumen, R TM cloudy in appearance. No oropharyngeal lesions, mucous membranes moist. Small amount of clear posterior pharyngeal exudate.   NECK: No carotid bruits.  No palpable cervical or supraclavicular lymphadenopathy. Thyroid smooth, normal size  CV: Regular rate and rhythm. Normal S1 and S2. No murmurs, gallops, or rubs  RESP: Normal respiratory effort.  Clear to auscultation bilaterally without wheezes, rhonchi or crackles.  GI: Normal abdominal bowel sounds. Soft, non-tender and non-distended.  GU: deferred  EXT:  No lower extremity edema. Posterior tibial pulses  and dorsalis pedis pulses are 2+ and symmetric.  MSK: Gait and station unremarkable. Normal ROM major joints. Normal strength and tone of proximal muscles.  SKIN: No rashes or suspicious focal lesions noted.  LYMPH NODES: Cervical, supraclavicular, and axillary nodes normal.  NEURO: Cranial nerves II- XII grossly intact.  No focal neurologic deficits.  PSYCHIATRIC: Alert and oriented x 3. Mood normal.       Labs:     No results found for this visit on 03/20/21.        I attest that I, Dolan Amen, personally documented this note while acting as scribe for Noralyn Pick, FNP.    Dolan Amen, Scribe.    The documentation recorded by the scribe accurately reflects the service I personally performed and the decisions made by me.    Noralyn Pick, FNP

## 2021-03-24 NOTE — Unmapped (Signed)
[facility] Refill Coordination Note    Specialty Medication(s) to be Shipped:   Inflammatory Disorders: James Singh    Other medication(s) to be shipped: folic acid     James Singh, DOB: 03-04-1990  Phone: [phone] (home) [phone] (work)      All above HIPAA information was verified with patient.     Was a Nurse, learning disability used for this call? No    Completed refill call assessment today to schedule patient's medication shipment from the [facility] [phone]).  All relevant notes have been reviewed.     Specialty medication(s) and dose(s) confirmed: Regimen is correct and unchanged.   Changes to medications: Deontra reports no changes at this time.  Changes to insurance: No  New side effects reported not previously addressed with a pharmacist or physician: None reported  Questions for the pharmacist: No    Confirmed patient received a Conservation officer, historic buildings and a Surveyor, mining with first shipment. The patient will receive a drug information handout for each medication shipped and additional FDA Medication Guides as required.       DISEASE/MEDICATION-SPECIFIC INFORMATION        N/A    SPECIALTY MEDICATION ADHERENCE     Medication Adherence    Patient reported X missed doses in the last month: 0  Specialty Medication: James Singh  Patient is on additional specialty medications: No  Patient is on more than two specialty medications: No  Any gaps in refill history greater than 2 weeks in the last 3 months: no  Demonstrates understanding of importance of adherence: yes  Informant: patient              Were doses missed due to medication being on hold? No    Xeljanz 11mg : Patient has 12 days of medication on hand    REFERRAL TO PHARMACIST     Referral to the pharmacist: Not needed      [facility]     Shipping address confirmed in Epic.     Delivery Scheduled: Yes, Expected medication delivery date: 2/21.     Medication will be delivered via UPS to the prescription address in Epic WAM.    James Singh   [facility] Specialty Technician

## 2021-03-30 MED FILL — XELJANZ XR 11 MG TABLET,EXTENDED RELEASE: ORAL | 30 days supply | Qty: 30 | Fill #7

## 2021-03-30 MED FILL — FOLIC ACID 1 MG TABLET: ORAL | 30 days supply | Qty: 30 | Fill #6

## 2021-04-22 NOTE — Unmapped (Signed)
[facility] Refill Coordination Note    James Singh, DOB: 02/19/1990  Phone: [phone] (home) [phone] (work)      All above HIPAA information was verified with patient.     Specialty Rx Medication Refill Questionnaire 04/21/2021   Which Medications would you like refilled and shipped? James Singh   Please list all current allergies: weeds, pollen, dust mites, sulfa drugs   Have you missed any doses in the last 30 days? No   Have you had any changes to your medication(s) since your last refill? No   How many days remaining of each medication do you have at home? 19 days   Have you experienced any side effects in the last 30 days? No   Please enter the full address (street address, city, state, zip code) where you would like your medication(s) to be delivered to. [address], [place], [place] [address]   Please specify on which day you would like your medication(s) to arrive. Note: if you need your medication(s) within 3 days, please call the pharmacy to schedule your order at [phone]  05/06/2021   Has your insurance changed since your last refill? No   Would you like a pharmacist to call you to discuss your medication(s)? No   Do you require a signature for your package? (Note: if we are billing Medicare Part B or your order contains a controlled substance, we will require a signature) No         Completed refill call assessment today to schedule patient's medication shipment from the [facility] [phone]).  All relevant notes have been reviewed.       Confirmed patient received a Conservation officer, historic buildings and a Surveyor, mining with first shipment. The patient will receive a drug information handout for each medication shipped and additional FDA Medication Guides as required.         REFERRAL TO PHARMACIST     Referral to the pharmacist: Not needed      [facility]     Shipping address confirmed in Epic.     Delivery Scheduled: Yes, Expected medication delivery date: 3/29. Medication will be delivered via UPS to the prescription address in Epic WAM.    James Singh   [facility] Specialty Technician

## 2021-05-05 MED FILL — FOLIC ACID 1 MG TABLET: ORAL | 30 days supply | Qty: 30 | Fill #7

## 2021-05-05 MED FILL — XELJANZ XR 11 MG TABLET,EXTENDED RELEASE: ORAL | 30 days supply | Qty: 30 | Fill #8

## 2021-05-26 ENCOUNTER — Ambulatory Visit: Admit: 2021-05-26 | Discharge: 2021-05-27 | Payer: PRIVATE HEALTH INSURANCE

## 2021-05-26 DIAGNOSIS — L03039 Cellulitis of unspecified toe: Principal | ICD-10-CM

## 2021-05-26 MED ORDER — METRONIDAZOLE 500 MG TABLET
ORAL_TABLET | Freq: Three times a day (TID) | ORAL | 0 refills | 7 days | Status: CP
Start: 2021-05-26 — End: ?

## 2021-05-26 NOTE — Unmapped (Signed)
Assessment and Plan:     James Singh was seen today for toe injury.    Diagnoses and all orders for this visit:    Paronychia of great toe          -     Home care advice and reassurance: Warm soaks to foot twice per day, elevation of foot.        -    Given ongoing symptoms, added metronidazole to doxycycline.        -    Return instructions reviewed with patient, patient verbalized understanding of agreeable with plan of care.  -    metroNIDAZOLE (FLAGYL) 500 MG tablet; Take 1 tablet (500 mg total) by mouth every eight (8) hours.        Barriers to recommended plan: None identified    Return if symptoms worsen or fail to improve, for As scheduled with PCP.      Subjective:     HPI: James Singh is a 31 y.o. male here for Toe Injury (Right big toe swelling. ).    Big toe: swelling around nail  X 1-1.5 weeks  No home measures tried  No fever, no chills,  no vomiting, no nausea  No pins, needles, weakness in BLE  No history of injury, trauma, falls.  No history of increased activity intensity or frequency.  No joint pain, redness, swelling in bilateral feet  Ambulating and performing ADLs without interference of toe pain.  No hx of MRSA, abscesses  Taking doxycycline for acne.    Hx of RA-taking xeljanz  R knee, left hip joints impacted  Feels medication helpful for RA symptoms      I have reviewed past medical, surgical, medications, allergies, social and family histories today and updated them in Epic where appropriate.    ROS:     Review of systems negative unless otherwise noted as per HPI.      Objective:     Vitals:    05/26/21 0916   BP: 120/64   Pulse: 77   Temp: 36.5 ??C (97.7 ??F)   SpO2: 98%     Body mass index is 33.42 kg/m??.    Physical Exam  Vitals and nursing note reviewed.   Constitutional:       Appearance: Normal appearance.   Musculoskeletal:      Comments: FROM in B feet, no joint pain, redness, swelling in Joints B feet  Weight bearing, FROM in B ankles  Sensation, pulsation intact B feet Skin:     Capillary Refill: Capillary refill takes less than 2 seconds.      Comments: Redness, swelling noted anterior portion of nail bed on R big toe  Tenderness noted medial aspect of nail bed  Nail intact.   Neurological:      General: No focal deficit present.      Mental Status: He is alert and oriented to person, place, and time. Mental status is at baseline.   Psychiatric:         Mood and Affect: Mood normal.         Behavior: Behavior normal.         Thought Content: Thought content normal.         Judgment: Judgment normal.          Medication adherence and barriers to the treatment plan have been addressed. Opportunities to optimize healthy behaviors have been discussed. Patient / caregiver voiced understanding.   I personally spent 20 minutes  face-to-face and non-face-to-face in the care of this patient, which includes all pre, intra, and post visit time on the date of service.  Cleon Dew, DNP, FNP-C  [facility] at [facility]  [phone] (F)    Note - This record has been created using [employer]. Chart creation errors have been sought, but may not always have been located. Such creation errors do not reflect on the standard of medical care.

## 2021-06-01 ENCOUNTER — Ambulatory Visit
Admit: 2021-06-01 | Discharge: 2021-06-02 | Payer: PRIVATE HEALTH INSURANCE | Attending: Student in an Organized Health Care Education/Training Program | Primary: Student in an Organized Health Care Education/Training Program

## 2021-06-01 DIAGNOSIS — M1380 Other specified arthritis, unspecified site: Principal | ICD-10-CM

## 2021-06-01 LAB — CBC W/ AUTO DIFF
BASOPHILS ABSOLUTE COUNT: 0 10*9/L (ref 0.0–0.1)
BASOPHILS RELATIVE PERCENT: 0.6 %
EOSINOPHILS ABSOLUTE COUNT: 0.1 10*9/L (ref 0.0–0.5)
EOSINOPHILS RELATIVE PERCENT: 1.6 %
HEMATOCRIT: 42.3 % (ref 39.0–48.0)
HEMOGLOBIN: 14.7 g/dL (ref 12.9–16.5)
LYMPHOCYTES ABSOLUTE COUNT: 0.9 10*9/L — ABNORMAL LOW (ref 1.1–3.6)
LYMPHOCYTES RELATIVE PERCENT: 16.3 %
MEAN CORPUSCULAR HEMOGLOBIN CONC: 34.8 g/dL (ref 32.0–36.0)
MEAN CORPUSCULAR HEMOGLOBIN: 30.8 pg (ref 25.9–32.4)
MEAN CORPUSCULAR VOLUME: 88.7 fL (ref 77.6–95.7)
MEAN PLATELET VOLUME: 8.3 fL (ref 6.8–10.7)
MONOCYTES ABSOLUTE COUNT: 0.6 10*9/L (ref 0.3–0.8)
MONOCYTES RELATIVE PERCENT: 9.6 %
NEUTROPHILS ABSOLUTE COUNT: 4.1 10*9/L (ref 1.8–7.8)
NEUTROPHILS RELATIVE PERCENT: 71.9 %
PLATELET COUNT: 164 10*9/L (ref 150–450)
RED BLOOD CELL COUNT: 4.77 10*12/L (ref 4.26–5.60)
RED CELL DISTRIBUTION WIDTH: 13.6 % (ref 12.2–15.2)
WBC ADJUSTED: 5.8 10*9/L (ref 3.6–11.2)

## 2021-06-01 LAB — CREATININE
CREATININE: 0.97 mg/dL
EGFR CKD-EPI (2021) MALE: >90 mL/min/{1.73_m2} (ref >=60)

## 2021-06-01 LAB — ALT: ALT (SGPT): 64 U/L — ABNORMAL HIGH (ref 10–49)

## 2021-06-01 LAB — AST: AST (SGOT): 44 U/L — ABNORMAL HIGH (ref <=34)

## 2021-06-01 NOTE — Unmapped (Unsigned)
Rheumatology Clinic Follow-up Note     Assessment/Plan:    James Singh is a 31 y.o.  male with a PMHx of seronegative peripheral inflammatory arthritis (hips, knees, jaw), disc disease of the cervical spine, and pontine cavernous malformation who presents today for follow-up.  Humira was stopped in Nov 2021 due to development of leukocytoclastic vasculitis??which was thought to be potentially medication related as ANCA serologies and cryoglobulins were negative and he denied any preceding infections. He had been controlled on Humira, and then had a flare while on Actemra in July 2022. He is currently on MTX 15mg  weekly and Xeljanz 11mg  daily. Today he's reporting new intermittent pain to R foot but unclear if inflammatory.     Seronegative peripheral inflammatory arthritis:   -??Continue??MTX 15mg  PO weekly with daily 1mg  folic acid. Refilled today.  -??Continue Xeljanz XR 11mg  PO daily   - Check CRP and sed rate today  - If symptoms worsen recommend trial of Enbrel - discussed risks with James Singh in pharmacy and she does not think there would be an increased risk of LCV on this.   - Update B/L foot xrays today. If overall normal will consider referral to [place] to eval for synovitis.   - Continue Tylenol 1000mg  BID PRN for joint pain.     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      Patient was seen and discussed with attending physician, James Singh    James Emmer, MD   Rheumatology Fellow, PGY5    Primary Care Provider: Noralyn Pick, FNP    HPI:  James Singh is a 31 y.o.  male with a PMHx of seronegative peripheral inflammatory arthritis (hips, knees, jaw), disc disease of the cervical spine, and pontine cavernous malformation who presents today for follow-up.    Today  ***    Disease History  James Singh developed inflammatory arthritis of the R knee and left hip in late 2013. ??He was placed on steroids, with some improvement. ??However, he flared during tapers and was subsequently placed on methotrexate. ??Due to persistent hip pain despite MTX, he was started on Humira in June 2014. ????The patient has a history of terminal ileal ulcer in 2013 and has followed with GI in the past. Biopsy was consistent with acute inflammation, not IBD.  ??In regard to his seronegative arthritis, he is on Humira 40mg  SQ q2 week, MTX 20mg  SQ qweek, and daily folic. He flared in 2018 when trying to taper MTX.  MTX was decreased to 15mg  weekly in Sept 2020 due to LFT elevated thought to be related to NASH and Tylenol use. Humira was stopped in Nov 2021 when he developed??cutaneous leukocytoclastic vasculitis. ??Actemra was started in January 2022 as an alternative biologic therapy. He had a flare in July 2022 with R knee & L hip involvement and was switched to [place].     Review of Systems:  Positive findings noted above, otherwise a 14 point review of systems was reviewed and negative    Past Medical, Surgical, Family and Social History reviewed and updated per EMR     Allergies:  Sulfa (sulfonamide antibiotics), Sulfacetamide sodium, Sulfamethoxazole-trimethoprim, Bee pollen, Dog dander, Dog epithelium allergenic extract, Mite extract, and Pollens extract    Medications:     Current Outpatient Medications:   ???  doxycycline (VIBRAMYCIN) 100 MG capsule, Take 1 capsule (100 mg total) by mouth Two (2) times a day., Disp: 20 capsule, Rfl:  0  ???  folic acid (FOLVITE) 1 MG tablet, Take 1 tablet (1 mg total) by mouth daily., Disp: 30 tablet, Rfl: 11  ???  metroNIDAZOLE (FLAGYL) 500 MG tablet, Take 1 tablet (500 mg total) by mouth every eight (8) hours., Disp: 21 tablet, Rfl: 0  ???  tofacitinib (XELJANZ XR) 11 mg Tb24, Take 1 tablet (11 mg) by mouth daily., Disp: 90 tablet, Rfl: 4      Objective   There were no vitals filed for this visit.    Physical Exam  General: well appearing, no acute distress  Eyes: EOMI, normal conjunctivae   ENT: MMM.  Oropharynx without any erythema or exudate.  No oral or nasal ulcers.  Neck: supple. No cervical lymphadenopathy  Cardiovascular: Regular rate and rhythm. No murmurs, rubs or gallops.   Pulmonary: Clear to auscultation bilaterally. Normal work of breathing.  Skin: ***no rash, lesions, breakdown. No purpura or petechiae. No digital ulcers.   Extremities: Warm and well perfused, no cyanosis, clubbing or edema  Musculoskeletal: ***No synovitis or tenderness to palpation in the hands, wrists, elbows, shoulders, knees, ankles, or feet. Full ROM throughout.   Neurologic: Cranial nerves grossly intact, strength 5/5 throughout.  Normal sensation  Psychiatric: Normal mood and affect.

## 2021-06-03 NOTE — Unmapped (Signed)
[facility] Refill Coordination Note    Specialty Medication(s) to be Shipped:   Inflammatory Disorders: James Singh    Other medication(s) to be shipped:  folic acid     James Singh, DOB: Sep 26, 1990  Phone: [phone] (home) [phone] (work)      All above HIPAA information was verified with patient.     Was a Nurse, learning disability used for this call? No    Completed refill call assessment today to schedule patient's medication shipment from the [facility] [phone]).  All relevant notes have been reviewed.     Specialty medication(s) and dose(s) confirmed: Regimen is correct and unchanged.   Changes to medications:  Doxycycline  Changes to insurance: No  New side effects reported not previously addressed with a pharmacist or physician: None reported  Questions for the pharmacist: No    Confirmed patient received a Conservation officer, historic buildings and a Surveyor, mining with first shipment. The patient will receive a drug information handout for each medication shipped and additional FDA Medication Guides as required.       DISEASE/MEDICATION-SPECIFIC INFORMATION        N/A    SPECIALTY MEDICATION ADHERENCE     Medication Adherence    Patient reported X missed doses in the last month: 0  Specialty Medication: James Singh  Patient is on additional specialty medications: No              Were doses missed due to medication being on hold? No    Xeljanz 11 mg: 10 days of medicine on hand        REFERRAL TO PHARMACIST     Referral to the pharmacist: Not needed      [facility]     Shipping address confirmed in Epic.     Delivery Scheduled: Yes, Expected medication delivery date: 06/08/21.     Medication will be delivered via UPS to the prescription address in Epic WAM.    James Singh   [facility] Specialty Technician

## 2021-06-04 ENCOUNTER — Ambulatory Visit: Admit: 2021-06-04 | Discharge: 2021-06-05 | Payer: PRIVATE HEALTH INSURANCE

## 2021-06-04 DIAGNOSIS — L03039 Cellulitis of unspecified toe: Principal | ICD-10-CM

## 2021-06-04 NOTE — Unmapped (Signed)
Can call: P: [phone]

## 2021-06-04 NOTE — Unmapped (Signed)
Assessment and Plan:     James Singh was seen today for toe injury.    Diagnoses and all orders for this visit:    Paronychia of great toe          -    Patient without improvement following 1 week course of doxycycline and metronidazole, warm soaks        -    No systemic symptoms, seronegative arthritis: Stable, recent visit to rheumatology (06/01/21).        -    Offered change in antibiotics versus referral to podiatrist.        -    After patient centered discussion, patient declined antibiotics, referred to [facility] podiatrist        -    Discussed referral process, if wait time lengthy--advised to contact provider & change location of referral.  -     Ambulatory referral to Podiatry; Future        Barriers to recommended plan: None identified    Return if symptoms worsen or fail to improve, for As scheduled w/PCP.      Subjective:     HPI: James Singh is a 31 y.o. male here for Toe Injury (Big toe swelling around nail voices it has gotten worse ).    Big toe:    Pt feels area --nailbed of R big toe-gotten bigger, more redness/swelling,  now walking difficult, noticed for past 2-3 days  No fever , no chills, no pins, needles, weakness in bilateral feet.  No joint swelling, redness, warmth, deformities.  Patient states adherent with doxycycline and added course of metronidazole, soak foot in Epsom salts, did not feel therapy is effective.  No history of injury, trauma, fall.  No change in activity or intensity, wearing similar footwear    History of seronegative arthritis-disease state stable, recent visit (06/01/21) with rheumatology   MTX 15mg  PO weekly with daily 1mg  folic acid, Continue Xeljanz XR 11mg  PO daily     I have reviewed past medical, surgical, medications, allergies, social and family histories today and updated them in Epic where appropriate.    ROS:     Review of systems negative unless otherwise noted as per HPI.      Objective:     Vitals:    06/04/21 1104   BP: 124/70   Pulse: 76   Temp: 36.6 ??C (97.9 ??F)   SpO2: 98%     Body mass index is 33.4 kg/m??.    Physical Exam  Vitals and nursing note reviewed.   Constitutional:       General: He is not in acute distress.     Appearance: Normal appearance. He is not ill-appearing, toxic-appearing or diaphoretic.   Musculoskeletal:      Comments: FROM in B feet, no joint redness, swelling, warmth, deformities  Pedal pulses intact, sensation/pulsation intact, cap refill intact.   Skin:     General: Skin is warm and dry.      Capillary Refill: Capillary refill takes less than 2 seconds.      Comments: See photo   Neurological:      General: No focal deficit present.      Mental Status: He is alert and oriented to person, place, and time. Mental status is at baseline.   Psychiatric:         Mood and Affect: Mood normal.         Behavior: Behavior normal.  Thought Content: Thought content normal.         Judgment: Judgment normal.          Medication adherence and barriers to the treatment plan have been addressed. Opportunities to optimize healthy behaviors have been discussed. Patient / caregiver voiced understanding.   I personally spent 25 minutes face-to-face and non-face-to-face in the care of this patient, which includes all pre, intra, and post visit time on the date of service.  Cleon Dew, DNP, FNP-C  [facility] at [facility]  [phone] (F)    Note - This record has been created using [employer]. Chart creation errors have been sought, but may not always have been located. Such creation errors do not reflect on the standard of medical care.

## 2021-06-05 MED FILL — FOLIC ACID 1 MG TABLET: ORAL | 30 days supply | Qty: 30 | Fill #8

## 2021-06-05 MED FILL — XELJANZ XR 11 MG TABLET,EXTENDED RELEASE: ORAL | 30 days supply | Qty: 30 | Fill #9

## 2021-07-03 NOTE — Unmapped (Signed)
[facility] Refill Coordination Note    GREY RAKESTRAW, DOB: 01-26-1991  Phone: [phone] (home) [phone] (work)      All above HIPAA information was verified with patient.     Specialty Rx Medication Refill Questionnaire 07/02/2021   Which Medications would you like refilled and shipped? [name] , Folic Acid   Please list all current allergies: sulfa, dust mites, dog dander, weeds   Have you missed any doses in the last 30 days? No   Have you had any changes to your medication(s) since your last refill? No   How many days remaining of each medication do you have at home? 13   Have you experienced any side effects in the last 30 days? No   Please enter the full address (street address, city, state, zip code) where you would like your medication(s) to be delivered to. [address] [place], [place] [address]   Please specify on which day you would like your medication(s) to arrive. Note: if you need your medication(s) within 3 days, please call the pharmacy to schedule your order at [phone]  07/08/2021   Has your insurance changed since your last refill? No   Would you like a pharmacist to call you to discuss your medication(s)? No   Do you require a signature for your package? (Note: if we are billing Medicare Part B or your order contains a controlled substance, we will require a signature) No         Completed refill call assessment today to schedule patient's medication shipment from the [facility] [phone]).  All relevant notes have been reviewed.       Confirmed patient received a Conservation officer, historic buildings and a Surveyor, mining with first shipment. The patient will receive a drug information handout for each medication shipped and additional FDA Medication Guides as required.         REFERRAL TO PHARMACIST     Referral to the pharmacist: Not needed      [facility]     Shipping address confirmed in Epic.     Delivery Scheduled: Yes, Expected medication delivery date: 5/31.     Medication will be delivered via UPS to the prescription address in Epic WAM.    [name]   [facility] Specialty Technician

## 2021-07-05 DIAGNOSIS — M1380 Other specified arthritis, unspecified site: Principal | ICD-10-CM

## 2021-07-05 MED ORDER — METHOTREXATE SODIUM 2.5 MG TABLET
ORAL_TABLET | 0 refills | 0 days
Start: 2021-07-05 — End: ?

## 2021-07-06 MED FILL — XELJANZ XR 11 MG TABLET,EXTENDED RELEASE: ORAL | 30 days supply | Qty: 30 | Fill #10

## 2021-07-06 MED FILL — FOLIC ACID 1 MG TABLET: ORAL | 30 days supply | Qty: 30 | Fill #9

## 2021-07-07 MED ORDER — METHOTREXATE SODIUM 2.5 MG TABLET
ORAL_TABLET | 0 refills | 0 days | Status: CP
Start: 2021-07-07 — End: ?

## 2021-07-07 NOTE — Unmapped (Signed)
Methotrexate refill  Last Visit Date: 06/01/2021  Next Visit Date: 09/16/2021    Lab Results   Component Value Date    ALT 64 (H) 06/01/2021    AST 44 (H) 06/01/2021    ALBUMIN 4.5 10/21/2020    CREATININE 0.97 06/01/2021     Lab Results   Component Value Date    WBC 5.8 06/01/2021    HGB 14.7 06/01/2021    HCT 42.3 06/01/2021    PLT 164 06/01/2021     Lab Results   Component Value Date    NEUTROPCT 71.9 06/01/2021    LYMPHOPCT 16.3 06/01/2021    MONOPCT 9.6 06/01/2021    EOSPCT 1.6 06/01/2021    BASOPCT 0.6 06/01/2021

## 2021-08-04 NOTE — Unmapped (Signed)
[facility] Refill Coordination Note    Specialty Medication(s) to be Shipped:   Inflammatory Disorders: James Singh    Other medication(s) to be shipped: folic acid     James Singh, DOB: 29-Nov-1990  Phone: [phone] (home) [phone] (work)      All above HIPAA information was verified with patient.     Was a Nurse, learning disability used for this call? No    Completed refill call assessment today to schedule patient's medication shipment from the [facility] [phone]).  All relevant notes have been reviewed.     Specialty medication(s) and dose(s) confirmed: Regimen is correct and unchanged.   Changes to medications: James Singh reports no changes at this time.  Changes to insurance: No  New side effects reported not previously addressed with a pharmacist or physician: None reported  Questions for the pharmacist: No    Confirmed patient received a Conservation officer, historic buildings and a Surveyor, mining with first shipment. The patient will receive a drug information handout for each medication shipped and additional FDA Medication Guides as required.       DISEASE/MEDICATION-SPECIFIC INFORMATION        N/A    SPECIALTY MEDICATION ADHERENCE     Medication Adherence    Patient reported X missed doses in the last month: 0  Specialty Medication: xeljanz  Patient is on additional specialty medications: No  Patient is on more than two specialty medications: No  Any gaps in refill history greater than 2 weeks in the last 3 months: no  Demonstrates understanding of importance of adherence: yes  Informant: patient              Were doses missed due to medication being on hold? No    Xeljanz 11mg : Patient has 14 days of medication on hand    REFERRAL TO PHARMACIST     Referral to the pharmacist: Not needed      [facility]     Shipping address confirmed in Epic.     Delivery Scheduled: Yes, Expected medication delivery date: 7/6.     Medication will be delivered via UPS to the prescription address in Epic WAM.    [name]   [facility] Specialty Technician

## 2021-08-12 DIAGNOSIS — M1380 Other specified arthritis, unspecified site: Principal | ICD-10-CM

## 2021-08-13 MED FILL — XELJANZ XR 11 MG TABLET,EXTENDED RELEASE: ORAL | 30 days supply | Qty: 30 | Fill #11

## 2021-08-13 MED FILL — FOLIC ACID 1 MG TABLET: ORAL | 30 days supply | Qty: 30 | Fill #10

## 2021-08-14 ENCOUNTER — Ambulatory Visit: Admit: 2021-08-14 | Discharge: 2021-08-15 | Payer: PRIVATE HEALTH INSURANCE | Attending: Family | Primary: Family

## 2021-08-14 DIAGNOSIS — J029 Acute pharyngitis, unspecified: Principal | ICD-10-CM

## 2021-08-14 DIAGNOSIS — J020 Streptococcal pharyngitis: Principal | ICD-10-CM

## 2021-08-14 MED ORDER — PREDNISONE 5 MG TABLET
ORAL_TABLET | ORAL | 0 refills | 20 days | Status: CP
Start: 2021-08-14 — End: 2021-09-03

## 2021-08-14 MED ORDER — AMOXICILLIN 875 MG-POTASSIUM CLAVULANATE 125 MG TABLET
ORAL_TABLET | Freq: Two times a day (BID) | ORAL | 0 refills | 10 days | Status: CP
Start: 2021-08-14 — End: 2021-08-24

## 2021-08-14 NOTE — Unmapped (Signed)
Called patient in response to [employer].  Patient states that he developed left hip pain yesterday. He is now having sharp pain with any range of motion of his left hip.  He has stiffness that last throughout much of the mid day and improves slightly, but majority of the achiness lasts throughout the day.  This does feel like a prior flare of his seronegative inflammatory arthritis.  He has been adherent with his methotrexate and Xeljanz.  We discussed calling in a prednisone taper for his symptoms. He was called in a taper starting at 10 mg daily in 2022, but felt he needed a slighter higher dose to control his symptoms. Will call in pred taper as follows: 20 mg daily x 5 days, 15 mg daily x 5 days, 10 mg daily x 5 days, 5 mg daily x 5 days. He will let us know if his symptoms do not improve. Will pass along to primary rheumatologist.

## 2021-08-14 NOTE — Unmapped (Signed)
Pt called to report he is having flare symptoms that started in his L hip yesterday.  Pt denies any numbness, tingling, swelling or redness.  Pt is experiencing a great deal of discomfort.  Pt wants to know if he should come in to be seen or could try either steroids or some other medication.    Provider notified

## 2021-08-14 NOTE — Unmapped (Signed)
[facility] at [facility], Acute Care Note  Assessment/Plan:      Diagnosis ICD-10-CM Associated Orders   1. Sore throat  J02.9 31 yo male presented in office with ongoing sore throat and strep symptoms. RST positive.    POCT Rapid Group A Strep      2. Strep throat  J02.0 Recommended a second round of antibiotics, Augmentin 875-125 mg twice daily for ten days and follow up if symptoms have not resolved after completion of medication. Advised patient to avoid scratchy or spicy foods and to force clear fluids. Patient is agreeable to plan.     amoxicillin-clavulanate (AUGMENTIN) 875-125 mg per tablet      30 minutes of clinical time, > 1/2 the office visit, Medication adherence and barriers to the treatment plan have been addressed. Opportunities to optimize healthy behaviors have been discussed. Patient / caregiver voiced understanding, advised F/U visit as needed.   Subjective:     Chief Complaint   Patient presents with    Sore Throat     DX with strep.  Felt better first 5-6 days on PCN then scratchy/sore throat and difficulty swallowing restarted.  Afebrile.        James Singh is here for follow up on strep. Patient reports he went to the [facility] two weeks ago and was treated with Penicillin x10 days. He is still having symptoms, notes he initially felt better and then symptoms returned around day 7. Currently he is feeling fatigued and has a sore throat, denies any other symptoms. States he has had strep a couple times in his life, but none that has been resistant. No known exposure.       Denies HA, Fever, chest pain, N/V/D, bowel or bladder issues, or swelling.   ROS: 12 systems reviewed, no other listed complaints.    Environmental History: not applicable    Pertinent Past Med Hx:    Past Medical History:   Diagnosis Date    Hyperlipidemia     Hypertension      Medications:   Current Outpatient Medications:     amLODIPine (NORVASC) 10 MG tablet, TAKE 1 TABLET (10 MG TOTAL) BY MOUTH IN THE MORNING., Disp: 90 tablet, Rfl: 1    atorvastatin (LIPITOR) 10 MG tablet, Take 1 tablet (10 mg total) by mouth daily., Disp: 90 tablet, Rfl: 1    benazepriL (LOTENSIN) 40 MG tablet, TAKE 1 TABLET BY MOUTH EVERY DAY, Disp: 90 tablet, Rfl: 1    chlorthalidone (HYGROTON) 25 MG tablet, Take 1 tablet (25 mg total) by mouth every morning. Start with 1/2 a tablet daily, after 1 week, may increase to 1 tablet daily if BP remains elevated, Disp: 90 tablet, Rfl: 1    fenofibrate micronized (LOFIBRA) 134 MG capsule, Take 1 capsule (134 mg total) by mouth daily before breakfast., Disp: 90 capsule, Rfl: 3    metoprolol succinate (TOPROL-XL) 25 MG 24 hr tablet, Start 1/2 a tab daily.  If home diastolic reading remains consistently above 90, increase dose to 1 a day., Disp: 30 tablet, Rfl: 11    tadalafiL (CIALIS) 5 MG tablet, TAKE ONE TABLET BY MOUTH DAILY, Disp: 30 tablet, Rfl: 6     Allergies:   Allergies   Allergen Reactions    Sulfa (Sulfonamide Antibiotics) Hives and Swelling    Sulfacetamide Sodium Swelling    Sulfamethoxazole-Trimethoprim Hives and Itching    Bee Pollen Itching     Other reaction(s): Other (See Comments)  Grasses; weeds;  Reaction:  Congestion; sneezing; etc.    Dog Dander Itching     Other reaction(s): Other (See Comments)  Congestion and sneezing    Dog Epithelium Allergenic Extract Itching and Rash     Congestion; sneezing    Mite Extract Itching     Other reaction(s): Other (See Comments)  Congestion; sneezing    Pollens Extract Itching     Grasses; weeds;  Reaction: Congestion; sneezing; etc.     Objective:          08/14/21 0944   BP: 118/80   Pulse: 89   Temp: 37.4 ??C (99.3 ??F)   Weight: (!) 115.7 kg (255 lb)   Height: 186.1 cm (6' 1.27)   PainSc: 2    PainLoc: Throat       General: Alert and oriented x3. Well-appearing. No acute distress.   HEENT:  Normocephalic.  Atraumatic. Conjunctiva and sclera normal. OP MMM without lesions. Posterior pharynx diffusely erythematous without white pustules or lesions noted. No visible exudate.   Neck:  Supple. No thyroid enlargement. No adenopathy.   Skin:  Warm, dry. No rash or lesions present.   Neuro:  Non-focal. No obvious weakness.   Psych:  Affect normal, eye contact good, speech clear and coherent.     Laboratories:   Strep test is positive.  Lab Results   Component Value Date    WBC 5.8 06/01/2021    HGB 14.7 06/01/2021    HCT 42.3 06/01/2021    PLT 164 06/01/2021       Lab Results   Component Value Date    NA 138 10/21/2020    K 4.2 10/21/2020    CL 106 10/21/2020    CO2 27.3 10/21/2020    BUN 15 10/21/2020    CREATININE 0.97 06/01/2021    GLU 87 10/21/2020    CALCIUM 10.1 10/21/2020    MG 1.9 04/21/2018    PHOS 4.4 01/13/2012       Lab Results   Component Value Date    BILITOT 1.1 10/21/2020    PROT 7.7 10/21/2020    ALBUMIN 4.5 10/21/2020    ALT 64 (H) 06/01/2021    AST 44 (H) 06/01/2021    ALKPHOS 80 10/21/2020       No results found for: PT, INR, APTT      I attest that I, Vergia Alberts, personally documented this note while acting as scribe for Noralyn Pick, FNP.      Vergia Alberts, Scribe.  06/30/2021     The documentation recorded by the scribe accurately reflects the service I personally performed and the decisions made by me.    Noralyn Pick, FNP

## 2021-08-24 ENCOUNTER — Other Ambulatory Visit: Payer: Self-pay

## 2021-08-24 ENCOUNTER — Encounter (HOSPITAL_COMMUNITY): Payer: Self-pay | Admitting: Emergency Medicine

## 2021-08-24 ENCOUNTER — Emergency Department (HOSPITAL_COMMUNITY)
Admission: EM | Admit: 2021-08-24 | Discharge: 2021-08-25 | Disposition: A | Discharge status: Home / Self Care | Payer: BC Managed Care – PPO | Attending: Student | Admitting: Student

## 2021-08-24 DIAGNOSIS — L509 Urticaria, unspecified: Secondary | ICD-10-CM | POA: Diagnosis not present

## 2021-08-24 DIAGNOSIS — X58XXXA Exposure to other specified factors, initial encounter: Secondary | ICD-10-CM | POA: Insufficient documentation

## 2021-08-24 DIAGNOSIS — T7840XA Allergy, unspecified, initial encounter: Secondary | ICD-10-CM | POA: Diagnosis present

## 2021-08-24 MED ORDER — DIPHENHYDRAMINE HCL 50 MG/ML IJ SOLN
25.0000 mg | Freq: Once | INTRAMUSCULAR | Status: AC
  Administered 2021-08-24: 25 mg via INTRAMUSCULAR
  Filled 2021-08-24: qty 1

## 2021-08-24 MED ORDER — FAMOTIDINE 20 MG PO TABS
20.0000 mg | ORAL_TABLET | Freq: Once | ORAL | Status: AC
  Administered 2021-08-24: 20 mg via ORAL
  Filled 2021-08-24: qty 1

## 2021-08-24 MED ORDER — DEXAMETHASONE SODIUM PHOSPHATE 10 MG/ML IJ SOLN
10.0000 mg | Freq: Once | INTRAMUSCULAR | Status: AC
  Administered 2021-08-24: 10 mg via INTRAMUSCULAR
  Filled 2021-08-24: qty 1

## 2021-08-24 NOTE — ED Triage Notes (Signed)
Pt presents with hives to trunk area.  Pt reports hives extend to groin area and he has feelings that his eyes are starting to swell.  Pt does not know what he is reacting to. No new exposures today.  Pt took 50 mg PO benadryl about 2100.

## 2021-08-24 NOTE — ED Provider Triage Note (Signed)
Emergency Medicine Provider Triage Evaluation Note  Charles Schroeder , a 31 y.o. male  was evaluated in triage.  Pt complains of allergic reaction, rash onset at 1400. Took Benadryl at 2100 w/o relief. Notes itching rash as well as subjective SOB. Continues to swallow saliva. No vomiting. Has been on Amoxicillin x 7 days for strep throat. Otherwise no new exposures or ingestions. 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  Review of Systems  Positive: As above Negative: As above  Physical Exam  BP (!) 156/114   Pulse 85   Temp 97.7 F (36.5 C) (Oral)   Resp 18   SpO2 95%  Gen:   Awake, no distress   Resp:  Normal effort  MSK:   Moves extremities without difficulty  Other:  Erythematous rash noted to chest, neck, upper back. No oral angioedema. No stridor. Tolerating secretions w/normal phonation. Lungs CTAB.  Medical Decision Making  Medically screening exam initiated at 10:48 PM.  Appropriate orders placed.  Charles Schroeder was informed that the remainder of the evaluation will be completed by another provider, this initial triage assessment does not replace that evaluation, and the importance of remaining in the ED until their evaluation is complete.  Allergic reaction - medications ordered. No present concern for anaphylaxis.    Antony Madura, PA-C 08/24/21 2252

## 2021-08-25 ENCOUNTER — Other Ambulatory Visit: Payer: Self-pay

## 2021-08-25 ENCOUNTER — Encounter (HOSPITAL_BASED_OUTPATIENT_CLINIC_OR_DEPARTMENT_OTHER): Payer: Self-pay

## 2021-08-25 ENCOUNTER — Emergency Department (HOSPITAL_BASED_OUTPATIENT_CLINIC_OR_DEPARTMENT_OTHER)
Admission: EM | Admit: 2021-08-25 | Discharge: 2021-08-25 | Disposition: A | Discharge status: Home / Self Care | Payer: BC Managed Care – PPO | Source: Home / Self Care | Attending: Emergency Medicine | Admitting: Emergency Medicine

## 2021-08-25 DIAGNOSIS — L509 Urticaria, unspecified: Secondary | ICD-10-CM | POA: Insufficient documentation

## 2021-08-25 MED ORDER — EPINEPHRINE 0.3 MG/0.3ML IJ SOAJ: 0.3000 mg | INTRAMUSCULAR | 0 refills | Status: AC | PRN

## 2021-08-25 MED ORDER — FAMOTIDINE 20 MG PO TABS
20.0000 mg | ORAL_TABLET | Freq: Once | ORAL | Status: DC
  Filled 2021-08-25: qty 1

## 2021-08-25 MED ORDER — FAMOTIDINE IN NACL 20-0.9 MG/50ML-% IV SOLN
20.0000 mg | Freq: Once | INTRAVENOUS | Status: AC
  Administered 2021-08-25: 20 mg via INTRAVENOUS
  Filled 2021-08-25: qty 50

## 2021-08-25 MED ORDER — DIPHENHYDRAMINE HCL 50 MG/ML IJ SOLN
12.5000 mg | Freq: Once | INTRAMUSCULAR | Status: AC
  Administered 2021-08-25: 12.5 mg via INTRAVENOUS
  Filled 2021-08-25: qty 1

## 2021-08-25 MED ORDER — DIPHENHYDRAMINE HCL 25 MG PO CAPS
25.0000 mg | ORAL_CAPSULE | Freq: Once | ORAL | Status: DC
  Filled 2021-08-25: qty 1

## 2021-08-25 NOTE — Discharge Instructions (Addendum)
Please get in contact with one of the 2 offices attached to these discharge papers.  They will be able to further evaluate your allergies.  You may also use any other office that you see fit.  Return with any difficulty breathing, swallowing, uncontrollable vomiting and diarrhea.  Continue to use Benadryl at home as needed for your itching rash.

## 2021-08-25 NOTE — ED Provider Notes (Signed)
[facility] EMERGENCY DEPT Provider Note   CSN: [id number] Arrival date & time: 08/25/21  1640     History  Chief Complaint  Patient presents with   Rash    Charles Schroeder is a 31 y.o. male presenting with a concern for an allergic reaction.  He was discharged from [facility] this morning for the same.  He returns because his rash is coming back and they want to "get ahead of any anaphylaxis."  No difficulty breathing, swallowing, lip swelling, nausea, vomiting or diarrhea.  No chest tightness.  Complains of rash to the bilateral axilla and lower abdomen   Rash      Home Medications Prior to Admission medications   Medication Sig Start Date End Date Taking? Authorizing Provider  acetaminophen (TYLENOL) 325 MG tablet Take 325-650 mg by mouth every 6 (six) hours as needed for mild pain (or headaches).    [provider]  ACTEMRA ACTPEN 162 MG/0.9ML SOAJ Inject 162 mg into the skin every Sunday. 06/16/20   [provider]  doxycycline (VIBRAMYCIN) 100 MG capsule Take 100 mg by mouth 2 (two) times daily. 07/30/20   [provider]  EPINEPHrine 0.3 mg/0.3 mL IJ SOAJ injection Inject 0.3 mLs (0.3 mg total) into the muscle once. Patient not taking: No sig reported 11/02/13   Blane Ohara, MD  EPINEPHrine 0.3 mg/0.3 mL IJ SOAJ injection Inject 0.3 mg into the muscle as needed for anaphylaxis. 08/25/21   Kommor, Allon Costlow, MD  famotidine (PEPCID) 20 MG tablet Take 1 tablet (20 mg total) by mouth 2 (two) times daily. 11/02/13   Eber Hong, MD  HYDROcodone-acetaminophen (NORCO/VICODIN) 5-325 MG tablet Take 1 tablet by mouth every 6 (six) hours as needed. Patient taking differently: Take 1 tablet by mouth every 6 (six) hours as needed (for pain). 07/13/20   Khatri, Hina, PA-C  methotrexate (RHEUMATREX) 2.5 MG tablet Take 10 mg by mouth every Sunday. 06/17/20   [provider]      Allergies    Sulfa antibiotics, Bactrim  [sulfamethoxazole-trimethoprim], Dog epithelium allergy skin test, Dust mite extract, Bee pollen, and Pollen extract    Review of Systems   Review of Systems  Skin:  Positive for rash.    Physical Exam Updated Vital Signs BP (!) 152/98 (BP Location: Right Arm)   Pulse 86   Temp 98.3 F (36.8 C)   Resp 14   Ht 6\' 1"  (1.854 m)   Wt 110.2 kg   SpO2 100%   BMI 32.05 kg/m  Physical Exam Vitals and nursing note reviewed.  Constitutional:      Appearance: Normal appearance.  HENT:     Head: Normocephalic and atraumatic.     Mouth/Throat:     Mouth: Mucous membranes are moist.     Pharynx: Oropharynx is clear.  Eyes:     General: No scleral icterus.    Conjunctiva/sclera: Conjunctivae normal.  Cardiovascular:     Rate and Rhythm: Normal rate and regular rhythm.  Pulmonary:     Effort: Pulmonary effort is normal. No respiratory distress.     Breath sounds: No wheezing.  Skin:    Findings: No rash.     Comments: Urticaria noted to the bilateral axilla.  Some nonspecific urticaria noted to the lower abdomen as well.  Neurological:     Mental Status: He is alert.  Psychiatric:        Mood and Affect: Mood normal.     ED Results / Procedures / Treatments  Labs (all labs ordered are listed, but only abnormal results are displayed) Labs Reviewed - No data to display  EKG None  Radiology No results found.  Procedures Procedures   Medications Ordered in ED Medications  famotidine (PEPCID) IVPB 20 mg premix (20 mg Intravenous New Bag/Given 08/25/21 1752)  diphenhydrAMINE (BENADRYL) injection 12.5 mg (12.5 mg Intravenous Given 08/25/21 1748)    ED Course/ Medical Decision Making/ A&P                           Medical Decision Making Risk Prescription drug management.   This patient presents to the ED for concern of allergic reaction.  Seen for this earlier but rash is returning.   This is not an exhaustive differential.    Past Medical History /  Co-morbidities / Social History: Rheumatoid arthritis, nonspecific allergies   Additional history: Reviewed patient's ED visit from earlier this morning   Physical Exam: Pertinent physical exam findings include nonspecific urticaria  Medications: I ordered medication including Benadryl and Pepcid. Reevaluation of the patient after these medicines showed that the patient improved. I have reviewed the patients home medicines and have made adjustments as needed.    Disposition: 31 year old male presenting with urticaria.  Was seen earlier at [facility] for an allergic reaction and treated.  Returns because the rash has come back.  Physical exam revealing of urticaria, no signs of anaphylaxis.  Treated with IV Pepcid and Benadryl.  At this time he is stable in appearance.  No vital sign derangements.  I believe he stable for discharge with Benadryl and he already has a refill on his EpiPen from this morning.  He will follow-up with an allergist as soon as possible.  He and his parents are agreeable.   I discussed this case with my attending physician Dr. Fredderick Phenix who cosigned this note including patient's presenting symptoms, physical exam, and planned diagnostics and interventions. Attending physician stated agreement with plan or made changes to plan which were implemented.     Final Clinical Impression(s) / ED Diagnoses Final diagnoses:  Urticaria    Rx / DC Orders  Results and diagnoses were explained to the patient. Return precautions discussed in full. Patient had no additional questions and expressed complete understanding.   This chart was dictated using voice recognition software.  Despite best efforts to proofread,  errors can occur which can change the documentation meaning.    Saddie Benders, PA-C 08/25/21 1811    Rolan Bucco, MD 08/25/21 [phone]

## 2021-08-25 NOTE — ED Triage Notes (Signed)
Patient here POV from Home.  Endorses being Evaluated and Discharged yesterday PM for Allergic Reaction to Unknown Substance. Discharged this AM after receiving Medications in ED and states he felt better and Rash Disappeared.   Returns for Reevaluation as Rash has Returned.   NAD Noted during Triage. A&Ox4. GCS 15. Ambulatory.

## 2021-08-25 NOTE — ED Provider Notes (Signed)
[facility] EMERGENCY DEPARTMENT Provider Note  CSN: [id number] Arrival date & time: 08/24/21 2233  Chief Complaint(s) Allergic Reaction  HPI Charles Schroeder is a 31 y.o. male with PMH rheumatoid arthritis, environmental allergies who presents emergency department for evaluation of an allergic reaction.  Patient states that yesterday he had gradual onset worsening of hives with no known trigger and presented with difficulty breathing and swallowing and swelling around the eyes.  Patient took 50 mg of Benadryl prior to arrival at 2100 on 08/24/2021 and upon arrival to triage received IM Decadron and Pepcid p.o. as well as an additional 25 mg of Benadryl IM.  Patient unfortunately had an extended ER lobby stay of approximately 10 hours and on presentation to his room at 9:45 AM on 08/25/2021 his symptoms have completely resolved.  Patient states he is getting increasingly frustrated that his underlying health issues have been difficult to diagnose.  He states that he previously saw an allergist during his last episode of anaphylaxis 8 years ago but had negative skin testing.  Past Medical History Past Medical History:  Diagnosis Date   Environmental allergies    Rheumatoid arthritis (HCC)    There are no problems to display for this patient.  Home Medication(s) Prior to Admission medications   Medication Sig Start Date End Date Taking? Authorizing Provider  EPINEPHrine 0.3 mg/0.3 mL IJ SOAJ injection Inject 0.3 mg into the muscle as needed for anaphylaxis. 08/25/21  Yes Zana Biancardi, MD  acetaminophen (TYLENOL) 325 MG tablet Take 325-650 mg by mouth every 6 (six) hours as needed for mild pain (or headaches).    [provider]  ACTEMRA ACTPEN 162 MG/0.9ML SOAJ Inject 162 mg into the skin every Sunday. 06/16/20   [provider]  doxycycline (VIBRAMYCIN) 100 MG capsule Take 100 mg by mouth 2 (two) times daily. 07/30/20   [provider]  EPINEPHrine 0.3  mg/0.3 mL IJ SOAJ injection Inject 0.3 mLs (0.3 mg total) into the muscle once. Patient not taking: No sig reported 11/02/13   Blane Ohara, MD  famotidine (PEPCID) 20 MG tablet Take 1 tablet (20 mg total) by mouth 2 (two) times daily. 11/02/13   Eber Hong, MD  HYDROcodone-acetaminophen (NORCO/VICODIN) 5-325 MG tablet Take 1 tablet by mouth every 6 (six) hours as needed. Patient taking differently: Take 1 tablet by mouth every 6 (six) hours as needed (for pain). 07/13/20   Khatri, Hina, PA-C  methotrexate (RHEUMATREX) 2.5 MG tablet Take 10 mg by mouth every Sunday. 06/17/20   [provider]                                                                                                                                    Past Surgical History Past Surgical History:  Procedure Laterality Date   KNEE SURGERY     MANDIBLE SURGERY     TYMPANOSTOMY TUBE PLACEMENT     WISDOM TOOTH  EXTRACTION     Family History No family history on file.  Social History Social History   Tobacco Use   Smoking status: Never   Smokeless tobacco: Never  Substance Use Topics   Alcohol use: Yes    Comment: socially   Drug use: No   Allergies Sulfa antibiotics, Bactrim [sulfamethoxazole-trimethoprim], Dog epithelium allergy skin test, Dust mite extract, Bee pollen, and Pollen extract  Review of Systems Review of Systems  Skin:  Positive for rash.    Physical Exam Vital Signs  I have reviewed the triage vital signs BP 133/88   Pulse 75   Temp 97.6 F (36.4 C) (Oral)   Resp 16   SpO2 98%   Physical Exam Vitals and nursing note reviewed.  Constitutional:      General: He is not in acute distress.    Appearance: He is well-developed.  HENT:     Head: Normocephalic and atraumatic.  Eyes:     Conjunctiva/sclera: Conjunctivae normal.  Cardiovascular:     Rate and Rhythm: Normal rate and regular rhythm.     Heart sounds: No murmur heard. Pulmonary:     Effort: Pulmonary effort is  normal. No respiratory distress.     Breath sounds: Normal breath sounds.  Abdominal:     Palpations: Abdomen is soft.     Tenderness: There is no abdominal tenderness.  Musculoskeletal:        General: No swelling.     Cervical back: Neck supple.  Skin:    General: Skin is warm and dry.     Capillary Refill: Capillary refill takes less than 2 seconds.  Neurological:     Mental Status: He is alert.  Psychiatric:        Mood and Affect: Mood normal.     ED Results and Treatments Labs (all labs ordered are listed, but only abnormal results are displayed) Labs Reviewed - No data to display                                                                                                                        Radiology No results found.  Pertinent labs & imaging results that were available during my care of the patient were reviewed by me and considered in my medical decision making (see MDM for details).  Medications Ordered in ED Medications  diphenhydrAMINE (BENADRYL) injection 25 mg (25 mg Intramuscular Given 08/24/21 2308)  famotidine (PEPCID) tablet 20 mg (20 mg Oral Given 08/24/21 2309)  dexamethasone (DECADRON) injection 10 mg (10 mg Intramuscular Given 08/24/21 2309)  Procedures Procedures  (including critical care time)  Medical Decision Making / ED Course   This patient presents to the ED for concern of allergic reaction, this involves an extensive number of treatment options, and is a complaint that carries with it a high risk of complications and morbidity.  The differential diagnosis includes allergic reaction, anaphylaxis, exercise-induced asthma, mast cell activation syndrome  MDM: Patient seen emergency room for evaluation of an allergic reaction.  On my physical examination, all of his symptoms have resolved and there is no urticaria  or oropharyngeal swelling.  I had a very long discussion with the patient about his symptoms, and given his previous history of an additional episode that started in adulthood with no identifiable trigger, current differential diagnosis is an environmental allergy or possibly mast cell activation syndrome.  He will need to follow-up outpatient with an allergist or speak with his rheumatologist about something like this.  He was discharged with a prescription for an EpiPen and will follow-up outpatient.   Additional history obtained: -Additional history obtained from mother -External records from outside source obtained and reviewed including: Chart review including previous notes, labs, imaging, consultation notes    Medicines ordered and prescription drug management: Meds ordered this encounter  Medications   diphenhydrAMINE (BENADRYL) injection 25 mg   famotidine (PEPCID) tablet 20 mg   dexamethasone (DECADRON) injection 10 mg   EPINEPHrine 0.3 mg/0.3 mL IJ SOAJ injection    Sig: Inject 0.3 mg into the muscle as needed for anaphylaxis.    Dispense:  1 each    Refill:  0    -I have reviewed the patients home medicines and have made adjustments as needed  Critical interventions none  Cardiac Monitoring: The patient was maintained on a cardiac monitor.  I personally viewed and interpreted the cardiac monitored which showed an underlying rhythm of: NSR  Social Determinants of Health:  Factors impacting patients care include: none   Reevaluation: After the interventions noted above, I reevaluated the patient and found that they have :improved  Co morbidities that complicate the patient evaluation  Past Medical History:  Diagnosis Date   Environmental allergies    Rheumatoid arthritis (HCC)       Dispostion: I considered admission for this patient, and given complete resolution of symptoms with significant observation.  In the lobby, patient safe for discharge with outpatient  follow-up     Final Clinical Impression(s) / ED Diagnoses Final diagnoses:  Allergic reaction, initial encounter     @PCDICTATION @    Shulem Mader, , MD 08/25/21 1010

## 2021-09-07 DIAGNOSIS — M1380 Other specified arthritis, unspecified site: Principal | ICD-10-CM

## 2021-09-07 MED ORDER — XELJANZ XR 11 MG TABLET,EXTENDED RELEASE
ORAL_TABLET | Freq: Every day | ORAL | 0 refills | 90 days | Status: CP
Start: 2021-09-07 — End: ?
  Filled 2021-09-09: qty 30, 30d supply, fill #0

## 2021-09-07 NOTE — Unmapped (Signed)
[name] refill   Last Visit Date: 06/01/2021  Next Visit Date: 09/16/2021    Lab Results   Component Value Date    ALT 64 (H) 06/01/2021    AST 44 (H) 06/01/2021    ALBUMIN 4.5 10/21/2020    CREATININE 0.97 06/01/2021     Lab Results   Component Value Date    WBC 5.8 06/01/2021    HGB 14.7 06/01/2021    HCT 42.3 06/01/2021    PLT 164 06/01/2021     Lab Results   Component Value Date    NEUTROPCT 71.9 06/01/2021    LYMPHOPCT 16.3 06/01/2021    MONOPCT 9.6 06/01/2021    EOSPCT 1.6 06/01/2021    BASOPCT 0.6 06/01/2021

## 2021-09-07 NOTE — Unmapped (Signed)
[facility] Clinical Assessment & Refill Coordination Note    James Singh, DOB: August 01, 1990  Phone: [phone] (home) [phone] (work)    All above HIPAA information was verified with patient.     Was a Nurse, learning disability used for this call? No    Specialty Medication(s):   Inflammatory Disorders: [name]     Current Outpatient Medications   Medication Sig Dispense Refill   ??? folic acid (FOLVITE) 1 MG tablet Take 1 tablet (1 mg total) by mouth daily. 30 tablet 11   ??? methotrexate 2.5 MG tablet TAKE 6 TABLETS BY MOUTH ONE TIME PER WEEK 72 tablet 0   ??? tofacitinib (XELJANZ XR) 11 mg Tb24 Take 1 tablet (11 mg) by mouth daily. 90 tablet 4     No current facility-administered medications for this visit.        Changes to medications: Major reports no changes at this time.    Allergies   Allergen Reactions   ??? Sulfa (Sulfonamide Antibiotics) Hives and Swelling   ??? Sulfacetamide Sodium Swelling   ??? Sulfamethoxazole-Trimethoprim Hives and Itching   ??? Bee Pollen Itching     Other reaction(s): Other (See Comments)  Grasses; weeds;  Reaction: Congestion; sneezing; etc.   ??? Dog Dander Itching     Other reaction(s): Other (See Comments)  Congestion and sneezing   ??? Dog Epithelium Allergenic Extract Itching and Rash     Congestion; sneezing   ??? Mite Extract Itching     Other reaction(s): Other (See Comments)  Congestion; sneezing   ??? Pollens Extract Itching     Grasses; weeds;  Reaction: Congestion; sneezing; etc.       Changes to allergies: No    SPECIALTY MEDICATION ADHERENCE     Xeljanz XR 11 mg: 8 days of medicine on hand       Medication Adherence    Patient reported X missed doses in the last month: 0  Specialty Medication: [name] XR 11mg   Patient is on additional specialty medications: No  Informant: patient          Specialty medication(s) dose(s) confirmed: Regimen is correct and unchanged.     Are there any concerns with adherence? No    Adherence counseling provided? Not needed    CLINICAL MANAGEMENT AND INTERVENTION      Clinical Benefit Assessment:    Do you feel the medicine is effective or helping your condition? No    Clinical Benefit counseling provided? Patient unsure if it still working, has had 2 flares in the past month.  He has appt on 8/9 and will discuss with MD then    Adverse Effects Assessment:    Are you experiencing any side effects? No    Are you experiencing difficulty administering your medicine? No    Quality of Life Assessment:    Quality of Life    Rheumatology  1. What impact has your specialty medication had on the reduction of your daily pain level?: Some  2. What impact has your specialty medication had on your ability to complete daily tasks (prepare meals, get dressed, etc...)?: Some  Oncology  Dermatology  Cystic Fibrosis          How many days over the past month did your condition  keep you from your normal activities? For example, brushing your teeth or getting up in the morning. 2 flares    Have you discussed this with your provider? Yes    Acute Infection Status:  Acute infections noted within Epic:  No active infections  Patient reported infection: None    Therapy Appropriateness:    Is therapy appropriate and patient progressing towards therapeutic goals? Yes, therapy is appropriate and should be continued    DISEASE/MEDICATION-SPECIFIC INFORMATION      N/A    PATIENT SPECIFIC NEEDS     - Does the patient have any physical, cognitive, or cultural barriers? No    - Is the patient high risk? No    - Does the patient require a Care Management Plan? No     SOCIAL DETERMINANTS OF HEALTH     At the [facility], we have learned that life circumstances - like trouble affording food, housing, utilities, or transportation can affect the health of many of our patients.   That is why we wanted to ask: are you currently experiencing any life circumstances that are negatively impacting your health and/or quality of life? Patient declined to answer    Social Determinants of Health     Financial Resource Strain: Low Risk    ??? Difficulty of Paying Living Expenses: Not hard at all   Internet Connectivity: Not on file   Food Insecurity: No Food Insecurity   ??? Worried About Programme researcher, broadcasting/film/video in the Last Year: Never true   ??? Ran Out of Food in the Last Year: Never true   Tobacco Use: Low Risk    ??? Smoking Tobacco Use: Never   ??? Smokeless Tobacco Use: Never   ??? Passive Exposure: Not on file   Housing/Utilities: Low Risk    ??? Within the past 12 months, have you ever stayed: outside, in a car, in a tent, in an overnight shelter, or temporarily in someone else's home (i.e. couch-surfing)?: No   ??? Are you worried about losing your housing?: No   ??? Within the past 12 months, have you been unable to get utilities (heat, electricity) when it was really needed?: No   Alcohol Use: Not At Risk   ??? How often do you have a drink containing alcohol?: 2 - 4 times per month   ??? How many drinks containing alcohol do you have on a typical day when you are drinking?: 1 - 2   ??? How often do you have 5 or more drinks on one occasion?: Never   Transportation Needs: No Transportation Needs   ??? Lack of Transportation (Medical): No   ??? Lack of Transportation (Non-Medical): No   Substance Use: Low Risk    ??? Taken prescription drugs for non-medical reasons: Never   ??? Taken illegal drugs: Never   ??? Patient indicated they have taken drugs in the past year for non-medical reasons: Yes, [positive answer(s)]: Not on file   Health Literacy: Not on file   Physical Activity: Not on file   Interpersonal Safety: Not on file   Stress: Not on file   Intimate Partner Violence: Not on file   Depression: Not on file   Social Connections: Not on file       Would you be willing to receive help with any of the needs that you have identified today? Not applicable       SHIPPING     Specialty Medication(s) to be Shipped:   Inflammatory Disorders: [name]    Other medication(s) to be shipped: No additional medications requested for fill at this time     Changes to insurance: No    Delivery Scheduled: Yes, Expected medication delivery date: 8/3.  Medication will be delivered via UPS to the confirmed prescription address in [facility].    The patient will receive a drug information handout for each medication shipped and additional FDA Medication Guides as required.  Verified that patient has previously received a Conservation officer, historic buildings and a Surveyor, mining.    The patient or caregiver noted above participated in the development of this care plan and knows that they can request review of or adjustments to the care plan at any time.      All of the patient's questions and concerns have been addressed.    Julianne Rice   [facility] Specialty Pharmacist

## 2021-09-16 ENCOUNTER — Ambulatory Visit
Admit: 2021-09-16 | Discharge: 2021-09-17 | Payer: PRIVATE HEALTH INSURANCE | Attending: Student in an Organized Health Care Education/Training Program | Primary: Student in an Organized Health Care Education/Training Program

## 2021-09-16 DIAGNOSIS — M1990 Unspecified osteoarthritis, unspecified site: Principal | ICD-10-CM

## 2021-09-16 DIAGNOSIS — M1380 Other specified arthritis, unspecified site: Principal | ICD-10-CM

## 2021-09-16 LAB — CBC W/ AUTO DIFF
BASOPHILS ABSOLUTE COUNT: 0 10*9/L (ref 0.0–0.1)
BASOPHILS RELATIVE PERCENT: 0.3 %
EOSINOPHILS ABSOLUTE COUNT: 0.2 10*9/L (ref 0.0–0.5)
EOSINOPHILS RELATIVE PERCENT: 1.8 %
HEMATOCRIT: 40.5 % (ref 39.0–48.0)
HEMOGLOBIN: 14.4 g/dL (ref 12.9–16.5)
LYMPHOCYTES ABSOLUTE COUNT: 0.9 10*9/L — ABNORMAL LOW (ref 1.1–3.6)
LYMPHOCYTES RELATIVE PERCENT: 9.1 %
MEAN CORPUSCULAR HEMOGLOBIN CONC: 35.6 g/dL (ref 32.0–36.0)
MEAN CORPUSCULAR HEMOGLOBIN: 31.1 pg (ref 25.9–32.4)
MEAN CORPUSCULAR VOLUME: 87.3 fL (ref 77.6–95.7)
MEAN PLATELET VOLUME: 8.4 fL (ref 6.8–10.7)
MONOCYTES ABSOLUTE COUNT: 0.9 10*9/L — ABNORMAL HIGH (ref 0.3–0.8)
MONOCYTES RELATIVE PERCENT: 9.7 %
NEUTROPHILS ABSOLUTE COUNT: 7.4 10*9/L (ref 1.8–7.8)
NEUTROPHILS RELATIVE PERCENT: 79.1 %
PLATELET COUNT: 237 10*9/L (ref 150–450)
RED BLOOD CELL COUNT: 4.64 10*12/L (ref 4.26–5.60)
RED CELL DISTRIBUTION WIDTH: 13.8 % (ref 12.2–15.2)
WBC ADJUSTED: 9.4 10*9/L (ref 3.6–11.2)

## 2021-09-16 LAB — HEPATIC FUNCTION PANEL
ALBUMIN: 4 g/dL (ref 3.4–5.0)
ALKALINE PHOSPHATASE: 111 U/L (ref 46–116)
ALT (SGPT): 19 U/L (ref 10–49)
AST (SGOT): 22 U/L (ref <=34)
BILIRUBIN DIRECT: 0.3 mg/dL (ref 0.00–0.30)
BILIRUBIN TOTAL: 0.9 mg/dL (ref 0.3–1.2)
PROTEIN TOTAL: 7.8 g/dL (ref 5.7–8.2)

## 2021-09-16 LAB — CREATININE
CREATININE: 0.98 mg/dL
EGFR CKD-EPI (2021) MALE: >90 mL/min/{1.73_m2} (ref >=60)

## 2021-09-16 MED ORDER — PREDNISONE 5 MG TABLET
ORAL_TABLET | ORAL | 0 refills | 20 days | Status: CP
Start: 2021-09-16 — End: 2021-10-06

## 2021-09-16 MED ORDER — PREDNISONE 10 MG TABLET
ORAL_TABLET | Freq: Every day | ORAL | 0 refills | 20 days | Status: CP
Start: 2021-09-16 — End: ?

## 2021-09-16 NOTE — Unmapped (Signed)
Rheumatology Clinic Follow-up Note     Assessment/Plan:    James Singh is a 31 y.o.  male with a PMHx of seronegative peripheral inflammatory arthritis (hips, knees, jaw), disc disease of the cervical spine, and pontine cavernous malformation who presents today for follow-up.  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 He had been controlled on Humira, and then had a flare while on Actemra in July 2022. He is currently on MTX 15mg  weekly and Xeljanz 11mg  daily.     Was doing well at last visit but now with x65mo of oligoarticular asymmetric joint pain/stiffness. Ankle exam with reduced ROM (pain limited predominately) but no significant joint effusion or CDS on POC [place]. Some slight fluid around extensor tendons without CDS, ? Tenosynovitis vs. Mechanical strain. Possibly flaring in setting of recent viral infection. We discussed re-trial of prednisone. If this has a drastic response with worsening after coming off again, then would favor change in DMARD (given seronegative oligoarticular asymmetric picture, favor switching Xeljanz to Cosentyx) over increasing MTX in light of NAFLD and prior LFT elevations with higher MTX dosing.     Seronegative peripheral inflammatory arthritis:   - Continue MTX 15mg  PO weekly with daily 1mg  folic acid. Refilled today.  - Continue Xeljanz XR 11mg  PO daily   - CBC w/ diff, Cr, hepatic panel  - Could consider alternative TNFi (Enbrel) if symptoms worsen  - Continue Tylenol 1000mg  BID PRN for joint pain.     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    James Emmer, MD   Assistant Professor of Medicine  Department of Medicine/Division of Rheumatology  [facility]    Primary Care Provider: Noralyn Pick, FNP    HPI:  James Singh is a 31 y.o.  male with a PMHx of seronegative peripheral inflammatory arthritis (hips, knees, jaw), disc disease of the cervical spine, and pontine cavernous malformation who presents today for follow-up.    Today  2 months ago started noting R knee, L hip, and both ankles started becoming more stiff and painful/swollen. Called in beginning of July and received a prednisone course, this resolved the flare up but he has continued to have stiffness that has continued to bother him predominately in his L ankle. Otherwise well without any new rashes, vision changes, red eyes, painful eyes, lower back symptoms, SOB, CP, fevers, chills, weight loss.    Disease History  James Singh developed inflammatory arthritis of the R knee and left hip in late 2013.  He was placed on steroids, with some improvement.  However, he flared during tapers and was subsequently placed on methotrexate.  Due to persistent hip pain despite MTX, he was started on Humira in June 2014.   The patient has a history of terminal ileal ulcer in 2013 and has followed with GI in the past. Biopsy was consistent with acute inflammation, not IBD.   In regard to his seronegative arthritis, he is on Humira 40mg  SQ q2 week, MTX 20mg  SQ qweek, and daily folic. He flared in 2018 when trying to taper MTX.  MTX was decreased to 15mg  weekly in Sept 2020 due to LFT elevated thought to be related to NASH and Tylenol use. Humira was stopped in Nov 2021 when he  developed cutaneous leukocytoclastic vasculitis.  Actemra was started in January 2022 as an alternative biologic therapy. He had a flare in July 2022 with R knee & L hip involvement and was switched to [place].     Review of Systems:  Positive findings noted above, otherwise a 14 point review of systems was reviewed and negative    Past Medical, Surgical, Family and Social History reviewed and updated per EMR     Allergies:  Sulfa (sulfonamide antibiotics), Sulfacetamide sodium, Sulfamethoxazole-trimethoprim, Bee pollen, Dog dander, Dog epithelium allergenic extract, Mite extract, and Pollens extract    Medications:     Current Outpatient Medications:   ???  folic acid (FOLVITE) 1 MG tablet, Take 1 tablet (1 mg total) by mouth daily., Disp: 30 tablet, Rfl: 11  ???  methotrexate 2.5 MG tablet, TAKE 6 TABLETS BY MOUTH ONE TIME PER WEEK, Disp: 72 tablet, Rfl: 0  ???  tofacitinib (XELJANZ XR) 11 mg Tb24, Take 1 tablet (11 mg total) by mouth daily., Disp: 90 tablet, Rfl: 0  ???  predniSONE (DELTASONE) 10 MG tablet, Take 1 tablet (10 mg total) by mouth daily. To be taken in case of flare during cruise, Disp: 20 tablet, Rfl: 0  ???  predniSONE (DELTASONE) 5 MG tablet, Take 4 tablets (20 mg total) by mouth daily for 5 days, THEN 3 tablets (15 mg total) daily for 5 days, THEN 2 tablets (10 mg total) daily for 5 days, THEN 1 tablet (5 mg total) daily for 5 days., Disp: 50 tablet, Rfl: 0      Objective   Vitals:    09/16/21 1123   BP: 135/93   BP Site: L Arm   BP Position: Sitting   BP Cuff Size: Large   Pulse: 95   Temp: 36.6 ??C (97.9 ??F)   TempSrc: Temporal   Weight: (!) 115.1 kg (253 lb 12.8 oz)   Height: 186.1 cm (6' 1.27)         Physical Exam  General: Well appearing, no acute distress  Skin: No petechial rash, cystic acne on face.   Musculoskeletal:   No swelling or tenderness of the hands, wrists, elbows.  Normal range of motion of the shoulders.  Right knee is status post prior surgery, very slightly warmth to touch but no overt effusion and normal ROM. No left knee swelling or tenderness.  Normal ROM of the R hip, slightly decreased ROM in external rotation of the L hip.   No discrete ankle swelling but pain upon active and passive ROM testing L>R.Marland Kitchen   No swelling/bogginess or TTP of any MTPs  Neurologic: Alert and conversing normally.  5/5 strength throughout.

## 2021-09-17 ENCOUNTER — Ambulatory Visit: Admit: 2021-09-17 | Discharge: 2021-09-18 | Payer: PRIVATE HEALTH INSURANCE | Attending: Family | Primary: Family

## 2021-09-17 DIAGNOSIS — J4520 Mild intermittent asthma, uncomplicated: Principal | ICD-10-CM

## 2021-09-17 DIAGNOSIS — T148XXA Other injury of unspecified body region, initial encounter: Principal | ICD-10-CM

## 2021-09-17 DIAGNOSIS — E785 Hyperlipidemia, unspecified: Principal | ICD-10-CM

## 2021-09-17 DIAGNOSIS — M1380 Other specified arthritis, unspecified site: Principal | ICD-10-CM

## 2021-09-17 LAB — LIPID PANEL
CHOLESTEROL/HDL RATIO SCREEN: 5.6 — ABNORMAL HIGH (ref 1.0–4.5)
CHOLESTEROL: 231 mg/dL — ABNORMAL HIGH (ref <=200)
HDL CHOLESTEROL: 41 mg/dL (ref 40–60)
LDL CHOLESTEROL CALCULATED: 166 mg/dL — ABNORMAL HIGH (ref 40–99)
NON-HDL CHOLESTEROL: 190 mg/dL — ABNORMAL HIGH (ref 70–130)
TRIGLYCERIDES: 121 mg/dL (ref 0–150)
VLDL CHOLESTEROL CAL: 24.2 mg/dL (ref 10–50)

## 2021-09-17 NOTE — Unmapped (Signed)
Diagnosis ICD-10-CM Associated Orders   1. Hyperlipidemia, unspecified hyperlipidemia type  E78.5 Last lipid panel 03/20/2021. TC 241, LDL 180, HDL 44 and TG 84. Will recheck lipid panel today. Continue low cholesterol diet and regular exercise.     Lipid Panel     Lipid Panel      2. Mild intermittent asthma without complication  J45.20 No recent exacerbations. Stable without medications.       3. Seronegative arthritis  M13.80 Defer ongoing management to Rheumatology.       4. Avulsion injury  T14.8XXA Recommend RICE. Notify clinic for symptoms persisting > 2 weeks.           I personally spent 30 minutes face-to-face and non-face-to-face in the care of this patient, which includes all pre, intra, and post visit time on the date of service.    Return in about 6 months (around 03/20/2022) for Next scheduled follow up.    HPI:      James Singh is here for   Chief Complaint   Patient presents with    Hyperlipidemia    Asthma     Fasting.  Six month F/U.  Checking to see if lipids improved without meds.     Asthma: Patient presents for  follow-up of asthma. Current symptoms None. Current medications: Rescue inhaler none, daily maintenance none. He is not seeing Pulmonology. Has no  action plan.       Hyperlipidemia: Patient presents for follow-up of dyslipidemia. Compliance with treatment has been good. The patient exercises three times a week. Patient not currently on medication for this condition. He is adhering to a low fat/low cholesterol diet.    Patient sustained an avulsion injury to left ankle several days ago. On Monday while attending a concert, he stepped wrong on a gravel parking lot and twisted his ankle. Icing ankle and using crutches.         ROS:      Comprehensive 10 point ROS negative unless otherwise stated in the HPI.      PCMH Components:     Medication adherence and barriers to the treatment plan have been addressed. Opportunities to optimize healthy behaviors have been discussed. Patient / caregiver voiced understanding.    Past Medical/Surgical History:     Past Medical History:   Diagnosis Date    Arthritis     RA    Asthma     Obesity      Past Surgical History:   Procedure Laterality Date    JAW BONE [facility] HISTORICAL RESULT)      KNEE ARTHROPLASTY Right     NECK INJECTION      STERLID - L4 OR L5    PR UPPER GI ENDOSCOPY,BIOPSY N/A 11/09/2018    Procedure: UGI ENDOSCOPY; WITH BIOPSY, SINGLE OR MULTIPLE;  Surgeon: Bronson Curb, MD;  Location: [facility] PROCEDURES [facility];  Service: Gastroenterology       Family History:     Family History   Problem Relation Age of Onset    Neuropathy Mother         damaged facial nerve    Hypertension Mother     No Known Problems Father     Mental illness Brother     Heart disease Maternal Aunt     Aortic aneurysm Maternal Aunt         abdominal    Heart disease Maternal Uncle     Aortic aneurysm Maternal Uncle  abdominal    Heart disease Maternal Grandmother     Hyperlipidemia Maternal Grandmother     Hypertension Maternal Grandmother     Stroke Maternal Grandmother     Aortic aneurysm Maternal Grandmother         abdominal    Heart disease Maternal Grandfather     Hyperlipidemia Maternal Grandfather     Hypertension Maternal Grandfather     Stroke Paternal Grandfather     Substance Abuse Disorder Neg Hx        Social History:     Social History     Tobacco Use    Smoking status: Never     Passive exposure: Never    Smokeless tobacco: Never   Vaping Use    Vaping Use: Never used   Substance Use Topics    Alcohol use: Yes     Alcohol/week: 4.0 standard drinks of alcohol     Types: 4 Shots of liquor per week     Comment: 1-2 drinks 2-4 times a month    Drug use: No       Allergies:     Sulfa (sulfonamide antibiotics), Sulfacetamide sodium, Sulfamethoxazole-trimethoprim, Bee pollen, Dog dander, Dog epithelium allergenic extract, Mite extract, and Pollens extract    Current Medications:     Current Outpatient Medications   Medication Sig Dispense Refill folic acid (FOLVITE) 1 MG tablet Take 1 tablet (1 mg total) by mouth daily. 30 tablet 11    methotrexate 2.5 MG tablet TAKE 6 TABLETS BY MOUTH ONE TIME PER WEEK 72 tablet 0    predniSONE (DELTASONE) 5 MG tablet Take 4 tablets (20 mg total) by mouth daily for 5 days, THEN 3 tablets (15 mg total) daily for 5 days, THEN 2 tablets (10 mg total) daily for 5 days, THEN 1 tablet (5 mg total) daily for 5 days. 50 tablet 0    tofacitinib (XELJANZ XR) 11 mg Tb24 Take 1 tablet (11 mg total) by mouth daily. 90 tablet 0    EPINEPHrine (EPIPEN) 0.3 mg/0.3 mL injection INJECT 0.3 MG INTO THE MUSCLE AS NEEDED FOR ANAPHYLAXIS. (Patient not taking: Reported on 09/17/2021)      predniSONE (DELTASONE) 10 MG tablet Take 1 tablet (10 mg total) by mouth daily. To be taken in case of flare during cruise (Patient not taking: Reported on 09/17/2021) 20 tablet 0     No current facility-administered medications for this visit.       Health Maintenance:     Health Maintenance   Topic Date Due    Pneumococcal Vaccine 0-64 (3 - PPSV23 or PCV20) 03/30/2020    COVID-19 Vaccine (5 - Pfizer risk series) 01/09/2021    Influenza Vaccine (1) 10/09/2021    DTaP/Tdap/Td Vaccines (10 - Td or Tdap) 03/21/2031    Hepatitis C Screen  Completed       Immunizations:     Immunization History   Administered Date(s) Administered    COVID-19 VAC,BIVALENT(83YR UP),PFIZER 11/14/2020    COVID-19 VACC,MRNA,(PFIZER)(PF) 04/15/2019, 05/08/2019, 10/08/2019    DTaP, Unspecified Formulation 09/28/1988, 11/29/1988, 02/18/1989, 02/23/1990, 09/01/1992    Hepatitis B Vaccine, Unspecified Formulation 07/22/1995, 08/31/1995, 06/08/1996    HiB, unspecified 02/18/1989, 07/14/1989, 10/28/1989    Influenza Vaccine Quad (IIV4 PF) 1mo+ injectable 01/28/2014, 11/21/2014, 03/08/2016, 01/03/2017, 12/05/2017, 10/21/2018    Influenza Vaccine Quad (IIV4 W/PRESERV) 45MO+ 11/09/2018    Influenza Virus Vaccine, unspecified formulation 12/09/2005, 11/22/2006, 10/21/2018, 11/06/2019, 11/14/2020 MMR 10/28/1989, 09/01/1992    Meningococcal ACWY, Unspecified Formulation 12/09/2005    PNEUMOCOCCAL  POLYSACCHARIDE 23 03/31/2015    Pneumococcal Conjugate 13-Valent 10/21/2014    Pneumococcal, Unspecified Formulation 12/09/2005    Polio Virus Vaccine, Unspecified Formulation 09/28/1988, 11/29/1988, 02/23/1990, 09/01/1992    TD(TDVAX),ADSORBED,2LF(IM)(PF) 10/04/2002    TdaP 06/24/2010, 02/09/2011, 03/20/2021     I have reviewed and (if needed) updated the patient's problem list, medications, allergies, past medical and surgical history, social and family history.     Vital Signs:     Wt Readings from Last 3 Encounters:   09/17/21 (!) 113.4 kg (250 lb)   09/16/21 (!) 115.1 kg (253 lb 12.8 oz)   08/14/21 (!) 115.7 kg (255 lb)     Temp Readings from Last 3 Encounters:   09/17/21 37.1 ??C (98.7 ??F) (Oral)   09/16/21 36.6 ??C (97.9 ??F) (Temporal)   08/14/21 37.4 ??C (99.3 ??F) (Oral)     BP Readings from Last 3 Encounters:   09/17/21 118/84   09/16/21 135/93   08/14/21 118/80     Pulse Readings from Last 3 Encounters:   09/17/21 96   09/16/21 95   08/14/21 89     Estimated body mass index is 34.39 kg/m?? as calculated from the following:    Height as of this encounter: 181.6 cm (5' 11.5).    Weight as of this encounter: 113.4 kg (250 lb).  Facility age limit for growth %iles is 20 years.      Objective:      General: Alert and oriented x3. Well-appearing. No acute distress.   HEENT:  Normocephalic.  Atraumatic. Conjunctiva and sclera normal. OP MMM without lesions.   Neck:  Supple. No thyroid enlargement. No adenopathy.   Heart:  Regular rate and rhythm. Normal S1, S2. No murmurs, rubs or gallops.   Lungs:  No respiratory distress.  Lungs clear to auscultation. No wheezes, rhonchi, or rales.   GI/GU:  Soft, +BS, nondistended, non-TTP. No palpable masses or organomegaly.   Extremities:  Mild edema at left lateral malleolus. No erythema, non-TTP. Peripheral pulses normal.   Skin:  Warm, dry. No rash or lesions present. Neuro:  Non-focal. No obvious weakness. Using crutches for ambulation to offload weight bearing of left ankle.   Psych:  Affect normal, eye contact good, speech clear and coherent.     Noralyn Pick, FNP

## 2021-09-29 NOTE — Unmapped (Signed)
[facility] Refill Coordination Note    Specialty Medication(s) to be Shipped:   Inflammatory Disorders: James Singh    Other medication(s) to be shipped: No additional medications requested for fill at this time     James Singh, DOB: 1990-08-15  Phone: [phone] (home) [phone] (work)      All above HIPAA information was verified with patient.     Was a Nurse, learning disability used for this call? No    Completed refill call assessment today to schedule patient's medication shipment from the [facility] [phone]).  All relevant notes have been reviewed.     Specialty medication(s) and dose(s) confirmed: Regimen is correct and unchanged.   Changes to medications: James Singh reports no changes at this time.  Changes to insurance: No  New side effects reported not previously addressed with a pharmacist or physician: None reported  Questions for the pharmacist: No    Confirmed patient received a Conservation officer, historic buildings and a Surveyor, mining with first shipment. The patient will receive a drug information handout for each medication shipped and additional FDA Medication Guides as required.       DISEASE/MEDICATION-SPECIFIC INFORMATION        N/A    SPECIALTY MEDICATION ADHERENCE     Medication Adherence    Patient reported X missed doses in the last month: 0  Specialty Medication: James Singh  Patient is on additional specialty medications: No  Patient is on more than two specialty medications: No  Any gaps in refill history greater than 2 weeks in the last 3 months: no  Demonstrates understanding of importance of adherence: yes  Informant: patient                          Were doses missed due to medication being on hold? No    Xeljanz 11mg : Patient has 12 days of medication on hand     REFERRAL TO PHARMACIST     Referral to the pharmacist: Not needed      [facility]     Shipping address confirmed in Epic.     Delivery Scheduled: Yes, Expected medication delivery date: 8/29.     Medication will be delivered via UPS to the prescription address in Epic [facility].    James Singh   [facility] Specialty Technician

## 2021-10-05 MED FILL — XELJANZ XR 11 MG TABLET,EXTENDED RELEASE: ORAL | 30 days supply | Qty: 30 | Fill #1

## 2021-10-14 NOTE — Unmapped (Signed)
About a week ago, he developed pain and swelling around his 1st big toe. He was using it a lot and is concerned he may have strained it or even potentially fractured it. He denies any focal trauma. This occurred while he was on 20mg  of prednisone. We discussed my concern that this could represent overuse strain vs. Small fracture vs. IA flare (? gout). He is going to see an orthopedist tomorrow due to his concern for fracture. Asked him to keep me updated. Currently on 10mg  of prednisone.

## 2021-10-15 ENCOUNTER — Ambulatory Visit
Admit: 2021-10-15 | Discharge: 2021-10-16 | Payer: PRIVATE HEALTH INSURANCE | Attending: Student in an Organized Health Care Education/Training Program | Primary: Student in an Organized Health Care Education/Training Program

## 2021-10-15 DIAGNOSIS — M1380 Other specified arthritis, unspecified site: Principal | ICD-10-CM

## 2021-10-15 LAB — URIC ACID: URIC ACID: 7.9 mg/dL

## 2021-10-15 LAB — C-REACTIVE PROTEIN: C-REACTIVE PROTEIN: 37 mg/L — ABNORMAL HIGH (ref <=10.0)

## 2021-10-15 LAB — SEDIMENTATION RATE: ERYTHROCYTE SEDIMENTATION RATE: 32 mm/h — ABNORMAL HIGH (ref 0–15)

## 2021-10-15 MED ORDER — COLCHICINE (GOUT) 0.6 MG TABLET
ORAL_TABLET | Freq: Every day | ORAL | 0 refills | 30 days | Status: CP
Start: 2021-10-15 — End: 2021-11-14

## 2021-10-15 NOTE — Unmapped (Signed)
Rheumatology Clinic Follow-up Note     Assessment/Plan:    James Singh is a 31 y.o.  male with a PMHx of seronegative peripheral inflammatory arthritis (hips, knees, jaw), disc disease of the cervical spine, and pontine cavernous malformation who presents today for follow-up.  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 He had been controlled on Humira, and then had a flare while on Actemra in July 2022. He is currently on MTX 15mg  weekly and Xeljanz 11mg  daily.     Coming in today for an acute visit in setting of new L 1st MTP pain/swelling. This happened acutely while he was on a cruise a week ago or so. He was worried about a fracture (although no preceding trauma) so saw ortho yesterday, XR was without fracture and he was given a boot and antibiotics for a possible cellulitis.    POC [place] today with moderate 1st MTP effusion and soft tissue fluid. No clear double contour or hyperechoic debris although hyperacute 1st MTP inflammatory arthritis occurring on a cruise (high purine diet) that is already improving after ~7d is suspicious. We must also consider his seronegative inflammatory arthritis although the rest of his joints are doing well overall and this flare was more hyperacute/painful than what he typically experiences with his seronegative IA. Must always consider septic joint with a new monoarthritis but he is otherwise without systemic signs/symptoms of infection and it is already improving. Nonetheless, given return precautions.    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 We discussed treating with colchicine for now and obtaining a uric acid (noting that it might be artificially lower during an acute flare). Pending his clinical course and uric acid values, we will revisit whether this might be gout vs. Other seronegative inflammatory arthritis.  - Colchicine 0.6mg  daily  - Uric acid  - ESR and CRP    Seronegative peripheral inflammatory arthritis:   - Continue MTX 15mg  PO weekly with daily 1mg  folic acid  - Continue Xeljanz XR 11mg  PO daily   - Could consider alternative TNFi (Enbrel) if symptoms worsen  - Continue Tylenol 1000mg  BID PRN for joint pain.     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 Already has follow up    [name], MD   Assistant Professor of Medicine  Department of Medicine/Division of Rheumatology  [facility]    Primary Care Provider: Noralyn Pick, FNP    HPI:  James Singh is a 31 y.o.  male with a PMHx of seronegative peripheral inflammatory arthritis (hips, knees, jaw), disc disease of the cervical spine, and pontine cavernous malformation who presents today for follow-up.    Today  L 1st MTP began to acute swell during his cruise one day, seemed to appear very quickly. Pain was significantly more than prior inflammatory arthritis flares and limited his ambulation. He has been using a crutch. Swelling and redness as well. Overall seems to stabilize and is now getting a little better. He was on 10mg  of prednisone at the time. Did not take anything else. Has continued his [name] and MTX without interruption, denies side effects.    Disease History  James Singh developed inflammatory arthritis of the R knee and left hip in late 2013.  He was placed on steroids, with  some improvement.  However, he flared during tapers and was subsequently placed on methotrexate.  Due to persistent hip pain despite MTX, he was started on Humira in June 2014.   The patient has a history of terminal ileal ulcer in 2013 and has followed with GI in the past. Biopsy was consistent with acute inflammation, not IBD.   In regard to his seronegative arthritis, he is on Humira 40mg  SQ q2 week, MTX 20mg  SQ qweek, and daily folic. He flared in 2018 when trying to taper MTX.  MTX was decreased to 15mg  weekly in Sept 2020 due to LFT elevated thought to be related to NASH and Tylenol use. Humira was stopped in Nov 2021 when he developed cutaneous leukocytoclastic vasculitis.  Actemra was started in January 2022 as an alternative biologic therapy. He had a flare in July 2022 with R knee & L hip involvement and was switched to [place].     Review of Systems:  Positive findings noted above, otherwise a 14 point review of systems was reviewed and negative    Past Medical, Surgical, Family and Social History reviewed and updated per EMR     Allergies:  Sulfa (sulfonamide antibiotics), Sulfacetamide sodium, Sulfamethoxazole-trimethoprim, Bee pollen, Dog dander, Dog epithelium allergenic extract, Mite extract, and Pollens extract    Medications:     Current Outpatient Medications:     EPINEPHrine (EPIPEN) 0.3 mg/0.3 mL injection, , Disp: , Rfl:     methotrexate 2.5 MG tablet, TAKE 6 TABLETS BY MOUTH ONE TIME PER WEEK, Disp: 72 tablet, Rfl: 0    tofacitinib (XELJANZ XR) 11 mg Tb24, Take 1 tablet (11 mg total) by mouth daily., Disp: 90 tablet, Rfl: 0    colchicine (COLCRYS) 0.6 mg tablet, Take 1 tablet (0.6 mg total) by mouth daily., Disp: 30 tablet, Rfl: 0      Objective   Vitals:    10/15/21 1049   BP: 127/93   BP Site: L Arm   BP Position: Sitting   BP Cuff Size: Medium   Pulse: 97   Temp: 36.4 ??C (97.6 ??F)   TempSrc: Temporal   Weight: (!) 118.7 kg (261 lb 9.6 oz)         Physical Exam  General: Well appearing, no acute distress  Skin: No petechial rash, cystic acne on face.   Musculoskeletal:   No swelling or tenderness of the hands, wrists, elbows.  Normal range of motion of the shoulders.  Right knee is status post prior surgery, cool to touch without overt effusion and normal ROM. No left knee swelling or tenderness.  Normal ROM of the R hip, slightly decreased ROM in external rotation of the L hip.   No discrete ankle swelling but pain upon active and passive ROM testing L>R.Marland Kitchen   No swelling/bogginess or TTP of any MTPs  Neurologic: Alert and conversing normally.  5/5 strength throughout.    I personally spent 32 minutes face-to-face and non-face-to-face in the care of this patient, which includes all pre, intra, and post visit time on the date of service.  All documented time was specific to the E/M visit and does not include any procedures that may have been performed.

## 2021-11-06 DIAGNOSIS — M1380 Other specified arthritis, unspecified site: Principal | ICD-10-CM

## 2021-11-06 MED ORDER — COLCHICINE (GOUT) 0.6 MG TABLET
ORAL_TABLET | Freq: Every day | ORAL | 0 refills | 0 days
Start: 2021-11-06 — End: ?

## 2021-11-06 NOTE — Unmapped (Signed)
Colchicine refill  Last Visit Date: 10/15/2021  Next Visit Date: 01/20/2022    Lab Results   Component Value Date    ALT 19 09/16/2021    AST 22 09/16/2021    ALBUMIN 4.0 09/16/2021    CREATININE 0.98 09/16/2021     Lab Results   Component Value Date    WBC 9.4 09/16/2021    HGB 14.4 09/16/2021    HCT 40.5 09/16/2021    PLT 237 09/16/2021     Lab Results   Component Value Date    NEUTROPCT 79.1 09/16/2021    LYMPHOPCT 9.1 09/16/2021    MONOPCT 9.7 09/16/2021    EOSPCT 1.8 09/16/2021    BASOPCT 0.3 09/16/2021

## 2021-11-09 MED ORDER — COLCHICINE (GOUT) 0.6 MG TABLET
ORAL_TABLET | Freq: Every day | ORAL | 0 refills | 30 days | Status: CP
Start: 2021-11-09 — End: ?

## 2021-11-09 MED ORDER — FOLIC ACID 1 MG TABLET
ORAL_TABLET | Freq: Every day | ORAL | 11 refills | 30 days | Status: CP
Start: 2021-11-09 — End: 2022-11-09
  Filled 2021-11-11: qty 30, 30d supply, fill #0

## 2021-11-09 NOTE — Unmapped (Signed)
Folic acid refill  Last Visit Date: 10/15/2021  Next Visit Date: 01/20/2022

## 2021-11-09 NOTE — Unmapped (Unsigned)
Assessment and Plan:     {No diagnosis found. (Refresh or delete this SmartLink)}    I personally spent *** minutes face-to-face and non-face-to-face in the care of this patient, which includes all pre, intra, and post visit time on the date of service.    No follow-ups on file.    HPI:      James Singh is here for No chief complaint on file.    {kerichronicdx:74702}    {keriacutedx:74703}    Reduced hearing:        ROS:      Comprehensive 10 point ROS negative unless otherwise stated in the HPI.      [facility] Components:     Medication adherence and barriers to the treatment plan have been addressed. Opportunities to optimize healthy behaviors have been discussed. Patient / caregiver voiced understanding.    Past Medical/Surgical History:     Past Medical History:   Diagnosis Date    Arthritis     RA    Asthma     Obesity      Past Surgical History:   Procedure Laterality Date    JAW BONE [facility] HISTORICAL RESULT)      KNEE ARTHROPLASTY Right     NECK INJECTION      STERLID - L4 OR L5    PR UPPER GI ENDOSCOPY,BIOPSY N/A 11/09/2018    Procedure: UGI ENDOSCOPY; WITH BIOPSY, SINGLE OR MULTIPLE;  Surgeon: Bronson Curb, MD;  Location: [facility];  Service: Gastroenterology       Family History:     Family History   Problem Relation Age of Onset    Neuropathy Mother         damaged facial nerve    Hypertension Mother     No Known Problems Father     Mental illness Brother     Heart disease Maternal Aunt     Aortic aneurysm Maternal Aunt         abdominal    Heart disease Maternal Uncle     Aortic aneurysm Maternal Uncle         abdominal    Heart disease Maternal Grandmother     Hyperlipidemia Maternal Grandmother     Hypertension Maternal Grandmother     Stroke Maternal Grandmother     Aortic aneurysm Maternal Grandmother         abdominal    Heart disease Maternal Grandfather     Hyperlipidemia Maternal Grandfather     Hypertension Maternal Grandfather     Stroke Paternal Grandfather     Substance Abuse Disorder Neg Hx        Social History:     Social History     Tobacco Use    Smoking status: Never     Passive exposure: Never    Smokeless tobacco: Never   Vaping Use    Vaping Use: Never used   Substance Use Topics    Alcohol use: Yes     Alcohol/week: 4.0 standard drinks of alcohol     Types: 4 Shots of liquor per week     Comment: 1-2 drinks 2-4 times a month    Drug use: No       Allergies:     Sulfa (sulfonamide antibiotics), Sulfacetamide sodium, Sulfamethoxazole-trimethoprim, Bee pollen, Dog dander, Dog epithelium allergenic extract, Mite extract, and Pollens extract    Current Medications:     Current Outpatient Medications   Medication Sig Dispense Refill  colchicine (COLCRYS) 0.6 mg tablet TAKE 1 TABLET BY MOUTH EVERY DAY 30 tablet 0    EPINEPHrine (EPIPEN) 0.3 mg/0.3 mL injection       methotrexate 2.5 MG tablet TAKE 6 TABLETS BY MOUTH ONE TIME PER WEEK 72 tablet 0    tofacitinib (XELJANZ XR) 11 mg Tb24 Take 1 tablet (11 mg total) by mouth daily. 90 tablet 0     No current facility-administered medications for this visit.       Health Maintenance:     Health Maintenance   Topic Date Due    Pneumococcal Vaccine 0-64 (3 - PPSV23 or PCV20) 03/30/2020    COVID-19 Vaccine (5 - Pfizer risk series) 01/09/2021    Influenza Vaccine (1) 10/09/2021    DTaP/Tdap/Td Vaccines (10 - Td or Tdap) 03/21/2031    Hepatitis C Screen  Completed       Immunizations:     Immunization History   Administered Date(s) Administered    COVID-19 VAC,BIVALENT(29YR UP),PFIZER 11/14/2020    COVID-19 VACC,MRNA,(PFIZER)(PF) 04/15/2019, 05/08/2019, 10/08/2019    DTaP, Unspecified Formulation 09/28/1988, 11/29/1988, 02/18/1989, 02/23/1990, 09/01/1992    Hepatitis B Vaccine, Unspecified Formulation 07/22/1995, 08/31/1995, 06/08/1996    HiB, unspecified 02/18/1989, 07/14/1989, 10/28/1989    Influenza Vaccine Quad (IIV4 PF) 39mo+ injectable 01/28/2014, 11/21/2014, 03/08/2016, 01/03/2017, 12/05/2017, 10/21/2018    Influenza Vaccine Quad (IIV4 W/PRESERV) 82MO+ 11/09/2018    Influenza Virus Vaccine, unspecified formulation 12/09/2005, 11/22/2006, 10/21/2018, 11/06/2019, 11/14/2020    MMR 10/28/1989, 09/01/1992    Meningococcal ACWY, Unspecified Formulation 12/09/2005    PNEUMOCOCCAL POLYSACCHARIDE 23 03/31/2015    Pneumococcal Conjugate 13-Valent 10/21/2014    Pneumococcal, Unspecified Formulation 12/09/2005    Polio Virus Vaccine, Unspecified Formulation 09/28/1988, 11/29/1988, 02/23/1990, 09/01/1992    TD(TDVAX),ADSORBED,2LF(IM)(PF) 10/04/2002    TdaP 06/24/2010, 02/09/2011, 03/20/2021     I have reviewed and (if needed) updated the patient's problem list, medications, allergies, past medical and surgical history, social and family history.     Vital Signs:     Wt Readings from Last 3 Encounters:   10/15/21 (!) 118.7 kg (261 lb 9.6 oz)   09/17/21 (!) 113.4 kg (250 lb)   09/16/21 (!) 115.1 kg (253 lb 12.8 oz)     Temp Readings from Last 3 Encounters:   10/15/21 36.4 ??C (97.6 ??F) (Temporal)   09/17/21 37.1 ??C (98.7 ??F) (Oral)   09/16/21 36.6 ??C (97.9 ??F) (Temporal)     BP Readings from Last 3 Encounters:   10/15/21 127/93   09/17/21 118/84   09/16/21 135/93     Pulse Readings from Last 3 Encounters:   10/15/21 97   09/17/21 96   09/16/21 95     Estimated body mass index is 35.98 kg/m?? as calculated from the following:    Height as of 09/17/21: 181.6 cm (5' 11.5).    Weight as of 10/15/21: 118.7 kg (261 lb 9.6 oz).  No height and weight on file for this encounter.      Objective:      General: Alert and oriented x3. Well-appearing. No acute distress. ***  HEENT:  Normocephalic.  Atraumatic. Conjunctiva and sclera normal. OP MMM without lesions. ***  Neck:  Supple. No thyroid enlargement. No adenopathy. ***  Heart:  Regular rate and rhythm. Normal S1, S2. No murmurs, rubs or gallops. ***  Lungs:  No respiratory distress.  Lungs clear to auscultation. No wheezes, rhonchi, or rales. ***  GI/GU:  Soft, +BS, nondistended, non-TTP. No palpable masses or organomegaly. ***  Extremities:  No edema. Peripheral pulses normal. ***  Skin:  Warm, dry. No rash or lesions present. ***  Neuro:  Non-focal. No obvious weakness. ***  Psych:  Affect normal, eye contact good, speech clear and coherent. ***       I attest that I, Whitnie Deleon E Morningstar Toft, personally documented this note while acting as scribe for Noralyn Pick, FNP.      Rendy Lazard E Eurydice Calixto, Scribe.  11/10/2021     The documentation recorded by the scribe accurately reflects the service I personally performed and the decisions made by me.    Noralyn Pick, FNP

## 2021-11-09 NOTE — Unmapped (Signed)
[facility] Refill Coordination Note    James Singh, DOB: [birth year]  Phone: [phone] (home) [phone] (work)      All above HIPAA information was verified with patient.         11/07/2021    12:52 PM   Specialty Rx Medication Refill Questionnaire   Which Medications would you like refilled and shipped? James Singh, Folic acid   Please list all current allergies: Sulfa drigs, grass pollen   Have you missed any doses in the last 30 days? No   Have you had any changes to your medication(s) since your last refill? No   How many days remaining of each medication do you have at home? 6   Have you experienced any side effects in the last 30 days? No   Please enter the full address (street address, city, state, zip code) where you would like your medication(s) to be delivered to. [address] , [place], [place] [address]   Please specify on which day you would like your medication(s) to arrive. Note: if you need your medication(s) within 3 days, please call the pharmacy to schedule your order at [phone]  11/11/2021   Has your insurance changed since your last refill? No   Would you like a pharmacist to call you to discuss your medication(s)? No   Do you require a signature for your package? (Note: if we are billing Medicare Part B or your order contains a controlled substance, we will require a signature) No         Completed refill call assessment today to schedule patient's medication shipment from the [facility] [phone]).  All relevant notes have been reviewed.       Confirmed patient received a Conservation officer, historic buildings and a Surveyor, mining with first shipment. The patient will receive a drug information handout for each medication shipped and additional FDA Medication Guides as required.         REFERRAL TO PHARMACIST     Referral to the pharmacist: Not needed      [facility]     Shipping address confirmed in Epic.     Delivery Scheduled: Yes, Expected medication delivery date: 11/11/21.     Medication will be delivered via UPS to the prescription address in Epic WAM.    Unk Lightning   [facility] Specialty Technician

## 2021-11-11 MED FILL — XELJANZ XR 11 MG TABLET,EXTENDED RELEASE: ORAL | 30 days supply | Qty: 30 | Fill #2

## 2021-11-28 DIAGNOSIS — M1380 Other specified arthritis, unspecified site: Principal | ICD-10-CM

## 2021-11-28 MED ORDER — METHOTREXATE SODIUM 2.5 MG TABLET
ORAL_TABLET | 0 refills | 0 days
Start: 2021-11-28 — End: ?

## 2021-11-30 MED ORDER — METHOTREXATE SODIUM 2.5 MG TABLET
ORAL_TABLET | 0 refills | 0 days | Status: CP
Start: 2021-11-30 — End: ?

## 2021-11-30 NOTE — Unmapped (Signed)
Methotrexate refill  Last Visit Date: 10/15/2021  Next Visit Date: 01/20/2022    Lab Results   Component Value Date    ALT 19 09/16/2021    AST 22 09/16/2021    ALBUMIN 4.0 09/16/2021    CREATININE 0.98 09/16/2021     Lab Results   Component Value Date    WBC 9.4 09/16/2021    HGB 14.4 09/16/2021    HCT 40.5 09/16/2021    PLT 237 09/16/2021     Lab Results   Component Value Date    NEUTROPCT 79.1 09/16/2021    LYMPHOPCT 9.1 09/16/2021    MONOPCT 9.7 09/16/2021    EOSPCT 1.8 09/16/2021    BASOPCT 0.3 09/16/2021

## 2021-12-05 DIAGNOSIS — M1380 Other specified arthritis, unspecified site: Principal | ICD-10-CM

## 2021-12-05 MED ORDER — COLCHICINE (GOUT) 0.6 MG TABLET
ORAL_TABLET | Freq: Every day | ORAL | 1 refills | 0 days
Start: 2021-12-05 — End: ?

## 2021-12-07 MED ORDER — COLCHICINE (GOUT) 0.6 MG TABLET
ORAL_TABLET | Freq: Every day | ORAL | 1 refills | 90 days | Status: CP
Start: 2021-12-07 — End: ?

## 2021-12-07 NOTE — Unmapped (Signed)
Colchicine refill  Last Visit Date: 10/15/2021  Next Visit Date: 12/09/2021    Lab Results   Component Value Date    ALT 19 09/16/2021    AST 22 09/16/2021    ALBUMIN 4.0 09/16/2021    CREATININE 0.98 09/16/2021     Lab Results   Component Value Date    WBC 9.4 09/16/2021    HGB 14.4 09/16/2021    HCT 40.5 09/16/2021    PLT 237 09/16/2021     Lab Results   Component Value Date    NEUTROPCT 79.1 09/16/2021    LYMPHOPCT 9.1 09/16/2021    MONOPCT 9.7 09/16/2021    EOSPCT 1.8 09/16/2021    BASOPCT 0.3 09/16/2021

## 2021-12-08 DIAGNOSIS — M1380 Other specified arthritis, unspecified site: Principal | ICD-10-CM

## 2021-12-08 MED ORDER — XELJANZ XR 11 MG TABLET,EXTENDED RELEASE
ORAL_TABLET | Freq: Every day | ORAL | 0 refills | 90 days | Status: CP
Start: 2021-12-08 — End: ?

## 2021-12-08 NOTE — Unmapped (Signed)
[facility] Refill Coordination Note    James Singh, DOB: 23-Jun-1990  Phone: [phone] (home) [phone] (work)      All above HIPAA information was verified with patient.         12/04/2021    10:17 PM   Specialty Rx Medication Refill Questionnaire   Which Medications would you like refilled and shipped? [name] and Folic Acid   Please list all current allergies: grass, sulfa, dust mites   Have you missed any doses in the last 30 days? No   Have you had any changes to your medication(s) since your last refill? No   How many days remaining of each medication do you have at home? 13 days   Have you experienced any side effects in the last 30 days? No   Please enter the full address (street address, city, state, zip code) where you would like your medication(s) to be delivered to. [address], [place], [place] [address]   Please specify on which day you would like your medication(s) to arrive. Note: if you need your medication(s) within 3 days, please call the pharmacy to schedule your order at [phone]  12/10/2021   Has your insurance changed since your last refill? No   Would you like a pharmacist to call you to discuss your medication(s)? No   Do you require a signature for your package? (Note: if we are billing Medicare Part B or your order contains a controlled substance, we will require a signature) No         Completed refill call assessment today to schedule patient's medication shipment from the [facility] [phone]).  All relevant notes have been reviewed.       Confirmed patient received a Conservation officer, historic buildings and a Surveyor, mining with first shipment. The patient will receive a drug information handout for each medication shipped and additional FDA Medication Guides as required.         REFERRAL TO PHARMACIST     Referral to the pharmacist: Not needed      [facility]     Shipping address confirmed in Epic.     Delivery Scheduled: Yes, Expected medication delivery date: 11/3.  However, Rx request for refills was sent to the provider as there are none remaining.     Medication will be delivered via UPS to the prescription address in Epic WAM.    [name]   [facility] Specialty Technician

## 2021-12-08 NOTE — Unmapped (Signed)
Xeljanze refill  Last Visit Date: 10/15/2021  Next Visit Date: 12/09/2021    Lab Results   Component Value Date    ALT 19 09/16/2021    AST 22 09/16/2021    ALBUMIN 4.0 09/16/2021    CREATININE 0.98 09/16/2021     Lab Results   Component Value Date    WBC 9.4 09/16/2021    HGB 14.4 09/16/2021    HCT 40.5 09/16/2021    PLT 237 09/16/2021     Lab Results   Component Value Date    NEUTROPCT 79.1 09/16/2021    LYMPHOPCT 9.1 09/16/2021    MONOPCT 9.7 09/16/2021    EOSPCT 1.8 09/16/2021    BASOPCT 0.3 09/16/2021

## 2021-12-09 ENCOUNTER — Ambulatory Visit
Admit: 2021-12-09 | Discharge: 2021-12-10 | Payer: PRIVATE HEALTH INSURANCE | Attending: Student in an Organized Health Care Education/Training Program | Primary: Student in an Organized Health Care Education/Training Program

## 2021-12-09 DIAGNOSIS — L4050 Arthropathic psoriasis, unspecified: Principal | ICD-10-CM

## 2021-12-09 DIAGNOSIS — M1380 Other specified arthritis, unspecified site: Principal | ICD-10-CM

## 2021-12-09 LAB — HEPATIC FUNCTION PANEL
ALBUMIN: 4.2 g/dL (ref 3.4–5.0)
ALKALINE PHOSPHATASE: 95 U/L (ref 46–116)
ALT (SGPT): 24 U/L (ref 10–49)
AST (SGOT): 27 U/L (ref <=34)
BILIRUBIN DIRECT: 0.3 mg/dL (ref 0.00–0.30)
BILIRUBIN TOTAL: 1.1 mg/dL (ref 0.3–1.2)
PROTEIN TOTAL: 8.2 g/dL (ref 5.7–8.2)

## 2021-12-09 LAB — CREATININE
CREATININE: 0.84 mg/dL
EGFR CKD-EPI (2021) MALE: >90 mL/min/{1.73_m2} (ref >=60)

## 2021-12-09 LAB — SEDIMENTATION RATE: ERYTHROCYTE SEDIMENTATION RATE: 30 mm/h — ABNORMAL HIGH (ref 0–15)

## 2021-12-09 LAB — CBC W/ AUTO DIFF
BASOPHILS ABSOLUTE COUNT: 0 10*9/L (ref 0.0–0.1)
BASOPHILS RELATIVE PERCENT: 1 %
EOSINOPHILS ABSOLUTE COUNT: 0.1 10*9/L (ref 0.0–0.5)
EOSINOPHILS RELATIVE PERCENT: 2.2 %
HEMATOCRIT: 42.6 % (ref 39.0–48.0)
HEMOGLOBIN: 15.2 g/dL (ref 12.9–16.5)
LYMPHOCYTES ABSOLUTE COUNT: 0.9 10*9/L — ABNORMAL LOW (ref 1.1–3.6)
LYMPHOCYTES RELATIVE PERCENT: 19.9 %
MEAN CORPUSCULAR HEMOGLOBIN CONC: 35.7 g/dL (ref 32.0–36.0)
MEAN CORPUSCULAR HEMOGLOBIN: 31.1 pg (ref 25.9–32.4)
MEAN CORPUSCULAR VOLUME: 87 fL (ref 77.6–95.7)
MEAN PLATELET VOLUME: 8.4 fL (ref 6.8–10.7)
MONOCYTES ABSOLUTE COUNT: 0.5 10*9/L (ref 0.3–0.8)
MONOCYTES RELATIVE PERCENT: 10.3 %
NEUTROPHILS ABSOLUTE COUNT: 3.2 10*9/L (ref 1.8–7.8)
NEUTROPHILS RELATIVE PERCENT: 66.6 %
NUCLEATED RED BLOOD CELLS: 1 /100{WBCs} (ref <=4)
PLATELET COUNT: 217 10*9/L (ref 150–450)
RED BLOOD CELL COUNT: 4.89 10*12/L (ref 4.26–5.60)
RED CELL DISTRIBUTION WIDTH: 13.9 % (ref 12.2–15.2)
WBC ADJUSTED: 4.7 10*9/L (ref 3.6–11.2)

## 2021-12-09 LAB — C-REACTIVE PROTEIN: C-REACTIVE PROTEIN: 33 mg/L — ABNORMAL HIGH (ref <=10.0)

## 2021-12-09 MED ORDER — COSENTYX PEN 150 MG/ML SUBCUTANEOUS
SUBCUTANEOUS | 1 refills | 84.00000 days | Status: CP
Start: 2021-12-09 — End: ?
  Filled 2021-12-17: qty 4, 28d supply, fill #0

## 2021-12-09 MED ORDER — XELJANZ XR 11 MG TABLET,EXTENDED RELEASE
ORAL_TABLET | Freq: Every day | ORAL | 0 refills | 90 days
Start: 2021-12-09 — End: ?

## 2021-12-09 NOTE — Unmapped (Signed)
Order discontinued  

## 2021-12-09 NOTE — Unmapped (Signed)
Rheumatology Clinic Follow-up Note     Assessment/Plan:    James Singh is a 31 y.o.  male with a PMHx of seronegative peripheral inflammatory arthritis (hips, knees, jaw), disc disease of the cervical spine, and pontine cavernous malformation who presents today for follow-up.  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 He had been controlled on Humira, and then had a flare while on Actemra in July 2022 so switched to [place]. He is currently on MTX 15mg  weekly and Xeljanz 11mg  daily.     Coming in today for his 3rd of 4th flare in past few months (L ankle today), all have been LE. Had considered possible concomitant gout as first flare was L 1st MTP with fairly acute onset and his uric acid was 7.9. However no evidence of gout specifically on POC [place] (did note effusion with +CDS but no double contour or hyperechoic debris). He has not responded well to colchicine either. As such, we discussed switching DMARDs, patient is amenable. 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    James Emmer, MD   Assistant Professor of Medicine  Department of Medicine/Division of Rheumatology  [facility]    Primary Care Provider: [name], FNP    HPI:  James Singh is a 31 y.o.  male with a PMHx of seronegative peripheral inflammatory arthritis (hips, knees, jaw), disc disease of the cervical spine, and pontine cavernous malformation who presents today for follow-up.    Today  L ankle flared up a week ago or so. Notes stiffness and some pain (when asked to rate it, he states he could be better). Otherwise stable without other issues. No preceding infection or EtOH/high purine diet intake. Colchicine has not been helpful. Remains on MTX and Xeljanz without issue.    Disease History  James Singh developed inflammatory arthritis of the R knee and left hip in late 2013.  He was placed on steroids, with some improvement.  However, he flared during tapers and was subsequently placed on methotrexate.  Due to persistent hip pain despite MTX, he was started on Humira in June 2014.   The patient has a history of terminal ileal ulcer in 2013 and has followed with GI in the past. Biopsy was consistent with acute inflammation, not IBD.   In regard to his seronegative arthritis, he is on Humira 40mg  SQ q2 week, MTX 20mg  SQ qweek, and daily folic. He flared in 2018 when trying to taper MTX.  MTX was decreased to 15mg  weekly in Sept 2020 due to LFT elevated thought to be related to NASH and  Tylenol use. Humira was stopped in Nov 2021 when he developed cutaneous leukocytoclastic vasculitis.  Actemra was started in January 2022 as an alternative biologic therapy. He had a flare in July 2022 with R knee & L hip involvement and was switched to James Singh.     Review of Systems:  Positive findings noted above, otherwise a 14 point review of systems was reviewed and negative    Past Medical, Surgical, Family and Social History reviewed and updated per EMR     Allergies:  Sulfa (sulfonamide antibiotics), Sulfacetamide sodium, Sulfamethoxazole-trimethoprim, Bee pollen, Dog dander, Dog epithelium allergenic extract, Mite extract, and Pollens extract    Medications:     Current Outpatient Medications:     colchicine (COLCRYS) 0.6 mg tablet, TAKE 1 TABLET BY MOUTH EVERY DAY, Disp: 90 tablet, Rfl: 1    EPINEPHrine (EPIPEN) 0.3 mg/0.3 mL injection, , Disp: , Rfl:     folic acid (FOLVITE) 1 MG tablet, Take 1 tablet (1 mg total) by mouth daily., Disp: 30 tablet, Rfl: 11    methotrexate 2.5 MG tablet, TAKE 6 TABLETS BY MOUTH ONE TIME PER WEEK, Disp: 72 tablet, Rfl: 0    secukinumab (COSENTYX PEN) 150 mg/mL PnIj injection, Inject 1 mL (150 mg total) under the skin once a week., Disp: 5 mL, Rfl: 0    secukinumab (COSENTYX PEN) 150 mg/mL PnIj injection, Inject 1 mL (150 mg total) under the skin every twenty-eight (28) days., Disp: 3 mL, Rfl: 1      Objective   Vitals:    12/09/21 1022   BP: 130/94   Pulse: 82   Temp: 36.4 ??C (97.5 ??F)   TempSrc: Temporal   Weight: (!) 120 kg (264 lb 9.6 oz)   Height: 188 cm (6' 2)         Physical Exam  General: Well appearing, no acute distress  Skin: No petechial rash, cystic acne on face.   Musculoskeletal:   No swelling or tenderness of the hands, wrists, elbows.  Normal range of motion of the shoulders.  Right knee is status post prior surgery, cool to touch without overt effusion and normal ROM. No left knee swelling or tenderness.  Normal ROM of the R hip, slightly decreased ROM in external rotation of the L hip (stable from prior)  L ankle slightly swollen with fairly limited ROM, R ankle cool without swelling and with full ROM  No swelling/bogginess or TTP of any MTPs  Neurologic: Alert and conversing normally.  5/5 strength throughout.    I personally spent 36 minutes face-to-face and non-face-to-face in the care of this patient, which includes all pre, intra, and post visit time on the date of service.  All documented time was specific to the E/M visit and does not include any procedures that may have been performed.

## 2021-12-10 NOTE — Unmapped (Signed)
James Singh 's XELJANZ XR 11 mg Tb24 (tofacitinib) shipment will be delayed as a result of no refills remain on the prescription.      I have reached out to the patient  at [phone] and left a voicemail message.  We will not reschedule the medication and have removed this/these medication(s) from the work request.  We have not confirmed the new delivery date.     Note: Provider denied the refill request stating therapy completed. A new prescription was received for cosentyx. Attempted to call patient to let him know and see if he would still like to fill the folic acid. Waiting to hear back from patient.

## 2021-12-11 MED FILL — FOLIC ACID 1 MG TABLET: ORAL | 30 days supply | Qty: 30 | Fill #1

## 2021-12-16 MED ORDER — EMPTY CONTAINER
2 refills | 0 days
Start: 2021-12-16 — End: ?

## 2021-12-16 NOTE — Unmapped (Signed)
[facility]   Patient Onboarding/Medication Counseling    [name] is a 31 y.o. male with psoriatic arthritis who I am counseling today on continuation of therapy.  I am speaking to the patient.    Was a Nurse, learning disability used for this call? No    Verified patient's date of birth / HIPAA.    Specialty medication(s) to be sent: Inflammatory Disorders: Cosentyx      Non-specialty medications/supplies to be sent: sharps container      Medications not needed at this time: n/a         Cosentyx (secukinumab)    Medication & Administration     Dosage: Psoriatic arthritis: Inject 150mg  under the skin at weeks 0, 1, 2, 3, and 4 followed by 150mg  every 4 weeks      Lab tests required prior to treatment initiation:  Tuberculosis: Tuberculosis screening resulted in a non-reactive Quantiferon TB Gold assay 2021.      Administration:      Prefilled Sensoready?? auto-injector pen  Gather all supplies needed for injection on a clean, flat working surface: medication pen removed from packaging, alcohol swab, sharps container, etc.  Look at the medication label - look for correct medication, correct dose, and check the expiration date  Look at the medication - the liquid visible in the window on the side of the pen device should appear clear and colorless to slightly yellow  Lay the auto-injector pen on a flat surface and allow it to warm up to room temperature for at least 15-30 minutes  Select injection site - you can use the front of your thigh or your belly (but not the area 2 inches around your belly button); if someone else is giving you the injection you can also use your upper arm in the skin covering your triceps muscle  Prepare injection site - wash your hands and clean the skin at the injection site with an alcohol swab and let it air dry, do not touch the injection site again before the injection  Twist off the purple safety cap in the direction of the arrow, do not remove until immediately prior to injection and do not touch the yellow needle cover  Put the white needle cover against your skin at the injection site at a 90 degree angle, hold the pen such that you can see the clear medication window  Press down and hold the pen firmly against your skin, there will be a click when the injection starts  Continue to hold the pen firmly against your skin for about 10-15 seconds - the window will start to turn solid green  There will be a second click sound when the injection is almost complete, verify the window is solid green to indicate the injection is complete and then pull the pen away from your skin  Dispose of the used auto-injector pen immediately in your sharps disposal container the needle will be covered automatically  If you see any blood at the injection site, press a cotton ball or gauze on the site and maintain pressure until the bleeding stops, do not rub the injection site      Adherence/Missed dose instructions:  If your injection is given more than 4 days after your scheduled injection date - consult your pharmacist for additional instructions on how to adjust your dosing schedule.        Goals of Therapy       Psoriatic arthritis  Achieve remission/inactive disease or low/minimal disease  activity  Maintenance of function  Minimization of systemic manifestations and comorbidities  Maintenance of effective psychosocial functioning      Side Effects & Monitoring Parameters     Injection site reaction (redness, irritation, inflammation localized to the site of administration)  Signs of a common cold - minor sore throat, runny or stuffy nose, etc.  Diarrhea    The following side effects should be reported to the provider:  Signs of a hypersensitivity reaction - rash; hives; itching; red, swollen, blistered, or peeling skin; wheezing; tightness in the chest or throat; difficulty breathing, swallowing, or talking; swelling of the mouth, face, lips, tongue, or throat; etc.  Reduced immune function - report signs of infection such as fever; chills; body aches; very bad sore throat; ear or sinus pain; cough; more sputum or change in color of sputum; pain with passing urine; wound that will not heal, etc.  Also at a slightly higher risk of some malignancies (mainly skin and blood cancers) due to this reduced immune function.  In the case of signs of infection - the patient should hold the next dose of Cosentyx?? and call your primary care provider to ensure adequate medical care.  Treatment may be resumed when infection is treated and patient is asymptomatic.  Muscle pain or weakness  Shortness of breath      Warnings, Precautions, & Contraindications     Have your bloodwork checked as you have been told by your prescriber  Talk with your doctor if you are pregnant, planning to become pregnant, or breastfeeding  Discuss the possible need for holding your dose(s) of Cosentyx?? when a planned procedure is scheduled with the prescriber as it may delay healing/recovery timeline       Drug/Food Interactions     Medication list reviewed in Epic. The patient was instructed to inform the care team before taking any new medications or supplements. No drug interactions identified.   If you have a latex allergy use caution when handling, the needle cap of the Cosentyx?? prefilled syringe and the safety cap for the Cosentyx Sensoready?? pen contains a derivative of natural rubber latex  Talk with you prescriber or pharmacist before receiving any live vaccinations while taking this medication and after you stop taking it      Storage, Handling Precautions, & Disposal     Store this medication in the refrigerator.  Do not freeze  May store intact Sensoready pens and 150 mg/mL prefilled syringes at ?30??C (?86??F) for up to 4 days; may return to the refrigerator if unused  Store in original packaging, protected from light  Do not shake  Dispose of used syringes/pens in a sharps disposal container           Current Medications (including OTC/herbals), Comorbidities and Allergies     Current Outpatient Medications   Medication Sig Dispense Refill    colchicine (COLCRYS) 0.6 mg tablet TAKE 1 TABLET BY MOUTH EVERY DAY 90 tablet 1    EPINEPHrine (EPIPEN) 0.3 mg/0.3 mL injection       folic acid (FOLVITE) 1 MG tablet Take 1 tablet (1 mg total) by mouth daily. 30 tablet 11    methotrexate 2.5 MG tablet TAKE 6 TABLETS BY MOUTH ONE TIME PER WEEK 72 tablet 0    secukinumab (COSENTYX PEN) 150 mg/mL PnIj injection Inject 1 mL (150 mg total) under the skin once a week for 5 weeks. Loading dose 5 mL 0    secukinumab (COSENTYX PEN) 150 mg/mL PnIj injection Inject  the contents of 1 pen (150 mg total) under the skin every twenty-eight (28) days. 3 mL 1     No current facility-administered medications for this visit.       Allergies   Allergen Reactions    Sulfa (Sulfonamide Antibiotics) Hives and Swelling    Sulfacetamide Sodium Swelling    Sulfamethoxazole-Trimethoprim Hives and Itching    Bee Pollen Itching     Other reaction(s): Other (See Comments)  Grasses; weeds;  Reaction: Congestion; sneezing; etc.    Dog Dander Itching     Other reaction(s): Other (See Comments)  Congestion and sneezing    Dog Epithelium Allergenic Extract Itching and Rash     Congestion; sneezing    Mite Extract Itching     Other reaction(s): Other (See Comments)  Congestion; sneezing    Pollens Extract Itching     Grasses; weeds;  Reaction: Congestion; sneezing; etc.       Patient Active Problem List   Diagnosis    Seronegative arthritis    Obesity    Mild intermittent asthma without complication    Dyspepsia    GERD (gastroesophageal reflux disease)    Hepatic steatosis    Pain of both shoulder joints    COVID-19 virus detected       Reviewed and up to date in Epic.    Appropriateness of Therapy     Acute infections noted within Epic:  No active infections  Patient reported infection: None    Is medication and dose appropriate based on diagnosis and infection status? Yes    Prescription has been clinically reviewed: Yes      Baseline Quality of Life Assessment      How many days over the past month did your psoriatic arthritis  keep you from your normal activities? For example, brushing your teeth or getting up in the morning. 5 out of 7 days per week .  Had a flare up in ankle     Financial Information     Medication Assistance provided: Prior Authorization    Anticipated copay of $0 reviewed with patient. Verified delivery address.    Delivery Information     Scheduled delivery date: 11/10    Expected start date: 11/10    Medication will be delivered via UPS to the prescription address in [facility].  This shipment will not require a signature.      Explained the services we provide at [facility] and that each month we would call to set up refills.  Stressed importance of returning phone calls so that we could ensure they receive their medications in time each month.  Informed patient that we should be setting up refills 7-10 days prior to when they will run out of medication.  A pharmacist will reach out to perform a clinical assessment periodically.  Informed patient that a welcome packet, containing information about our pharmacy and other support services, a Notice of Privacy Practices, and a drug information handout will be sent.      The patient or caregiver noted above participated in the development of this care plan and knows that they can request review of or adjustments to the care plan at any time.      Patient or caregiver verbalized understanding of the above information as well as how to contact the pharmacy at [phone] option 4 with any questions/concerns.  The pharmacy is open Monday through Friday 8:30am-4:30pm.  A pharmacist is available 24/7 via pager to answer  any clinical questions they may have.    Patient Specific Needs     Does the patient have any physical, cognitive, or cultural barriers? No    Does the patient have adequate living arrangements? (i.e. the ability to store and take their medication appropriately) Yes    Did you identify any home environmental safety or security hazards? No    Patient prefers to have medications discussed with  Patient     Is the patient or caregiver able to read and understand education materials at a high school level or above? Yes    Patient's primary language is  English     Is the patient high risk? No    SOCIAL DETERMINANTS OF HEALTH     At the [facility], we have learned that life circumstances - like trouble affording food, housing, utilities, or transportation can affect the health of many of our patients.   That is why we wanted to ask: are you currently experiencing any life circumstances that are negatively impacting your health and/or quality of life? No    Social Determinants of Health     Financial Resource Strain: Low Risk  (03/20/2021)    Overall Financial Resource Strain (CARDIA)     Difficulty of Paying Living Expenses: Not hard at all   Internet Connectivity: Not on file   Food Insecurity: No Food Insecurity (03/20/2021)    Hunger Vital Sign     Worried About Running Out of Food in the Last Year: Never true     Ran Out of Food in the Last Year: Never true   Tobacco Use: Low Risk  (12/09/2021)    Patient History     Smoking Tobacco Use: Never     Smokeless Tobacco Use: Never     Passive Exposure: Never   Housing/Utilities: Low Risk  (03/20/2021)    Housing/Utilities     Within the past 12 months, have you ever stayed: outside, in a car, in a tent, in an overnight shelter, or temporarily in someone else's home (i.e. couch-surfing)?: No     Are you worried about losing your housing?: No     Within the past 12 months, have you been unable to get utilities (heat, electricity) when it was really needed?: No   Alcohol Use: Not At Risk (09/17/2021)    Alcohol Use     How often do you have a drink containing alcohol?: 2 - 4 times per month     How many drinks containing alcohol do you have on a typical day when you are drinking?: 1 - 2     How often do you have 5 or more drinks on one occasion?: Never   Transportation Needs: No Transportation Needs (03/20/2021)    PRAPARE - Transportation     Lack of Transportation (Medical): No     Lack of Transportation (Non-Medical): No   Substance Use: Low Risk  (09/17/2021)    Substance Use     Taken prescription drugs for non-medical reasons: Never     Taken illegal drugs: Never     Patient indicated they have taken drugs in the past year for non-medical reasons: Yes, [positive answer(s)]: Not on file   Health Literacy: Not on file   Physical Activity: Not on file   Interpersonal Safety: Not on file   Stress: No Stress Concern Present (09/26/2019)    [employer] of Occupational Health - Occupational Stress Questionnaire     Feeling of Stress : Not at  all   Intimate Partner Violence: Not on file   Depression: At risk (07/30/2020)    PHQ-2     PHQ-2 Score: 4   Social Connections: Not on file       Would you be willing to receive help with any of the needs that you have identified today? Not applicable       Julianne Rice, PharmD  [facility] Specialty Pharmacist

## 2021-12-16 NOTE — Unmapped (Signed)
[facility] Specialty Medication Onboarding    Specialty Medication: COSENTYX PEN (2 PENS) 150 mg/mL Pnij injection (secukinumab)  Prior Authorization: Approved   Financial Assistance: Yes - copay card approved as secondary   Final Copay/Day Supply: $0 / 28 (LD)          $0 / 28 (MD)    Insurance Restrictions: Yes - max 1 month supply     Notes to Pharmacist: Must use [facility] [phone] (2-pen pack) to fill 4 mL for 28 days, then use [facility] [phone] (1-pen pack) to fill 1 mL for 28 days per insurance    The triage team has completed the benefits investigation and has determined that the patient is able to fill this medication at [facility]. Please contact the patient to complete the onboarding or follow up with the prescribing physician as needed.

## 2021-12-17 MED FILL — EMPTY CONTAINER: 90 days supply | Qty: 1 | Fill #0

## 2022-01-05 NOTE — Unmapped (Signed)
[facility] Clinical Assessment & Refill Coordination Note    James Singh, DOB: 26-Feb-1990  Phone: [phone] (home) [phone] (work)    All above HIPAA information was verified with patient.     Was a Nurse, learning disability used for this call? No    Specialty Medication(s):   Inflammatory Disorders: Cosentyx     Current Outpatient Medications   Medication Sig Dispense Refill    colchicine (COLCRYS) 0.6 mg tablet TAKE 1 TABLET BY MOUTH EVERY DAY 90 tablet 1    empty container Misc USE AS DIRECTED 1 each 2    EPINEPHrine (EPIPEN) 0.3 mg/0.3 mL injection       folic acid (FOLVITE) 1 MG tablet Take 1 tablet (1 mg total) by mouth daily. 30 tablet 11    methotrexate 2.5 MG tablet TAKE 6 TABLETS BY MOUTH ONE TIME PER WEEK 72 tablet 0    secukinumab (COSENTYX PEN) 150 mg/mL PnIj injection Inject 1 mL (150 mg total) under the skin once a week for 5 weeks. Loading dose 5 mL 0    secukinumab (COSENTYX PEN) 150 mg/mL PnIj injection Inject the contents of 1 pen (150 mg total) under the skin every twenty-eight (28) days. 3 mL 1     No current facility-administered medications for this visit.        Changes to medications: [name] reports no changes at this time.    Allergies   Allergen Reactions    Sulfa (Sulfonamide Antibiotics) Hives and Swelling    Sulfacetamide Sodium Swelling    Sulfamethoxazole-Trimethoprim Hives and Itching    Bee Pollen Itching     Other reaction(s): Other (See Comments)  Grasses; weeds;  Reaction: Congestion; sneezing; etc.    Dog Dander Itching     Other reaction(s): Other (See Comments)  Congestion and sneezing    Dog Epithelium Allergenic Extract Itching and Rash     Congestion; sneezing    Mite Extract Itching     Other reaction(s): Other (See Comments)  Congestion; sneezing    Pollens Extract Itching     Grasses; weeds;  Reaction: Congestion; sneezing; etc.       Changes to allergies: No    SPECIALTY MEDICATION ADHERENCE       Medication Adherence    Patient reported X missed doses in the last month: 0  Specialty Medication: Cosentyx  Patient is on additional specialty medications: No  Informant: patient                            Specialty medication(s) dose(s) confirmed: Regimen is correct and unchanged.     Are there any concerns with adherence? No    Adherence counseling provided? Not needed    CLINICAL MANAGEMENT AND INTERVENTION      Clinical Benefit Assessment:    Do you feel the medicine is effective or helping your condition? Yes.  It has helped a little bit    Clinical Benefit counseling provided? Not needed    Adverse Effects Assessment:    Are you experiencing any side effects? No    Are you experiencing difficulty administering your medicine? No    Quality of Life Assessment:    Quality of Life    Rheumatology  1. What impact has your specialty medication had on the reduction of your daily pain level?: Some  2. What impact has your specialty medication had on your ability to complete daily tasks (prepare meals, get dressed, etc...)?Marland Kitchen  Some  Oncology  Dermatology  Cystic Fibrosis          How many days over the past month did your RA  keep you from your normal activities? For example, brushing your teeth or getting up in the morning. 0    Have you discussed this with your provider? Not needed    Acute Infection Status:    Acute infections noted within Epic:  No active infections  Patient reported infection: None    Therapy Appropriateness:    Is therapy appropriate and patient progressing towards therapeutic goals? Yes, therapy is appropriate and should be continued    DISEASE/MEDICATION-SPECIFIC INFORMATION      For patients on injectable medications: Patient currently has 1 doses left.  Next injection is scheduled for 12/4.    Chronic Inflammatory Diseases: Have you experienced any flares in the last month? No    PATIENT SPECIFIC NEEDS     Does the patient have any physical, cognitive, or cultural barriers? No    Is the patient high risk? No    Did the patient require a clinical intervention? No    Does the patient require physician intervention or other additional services (i.e., nutrition, smoking cessation, social work)? No    SOCIAL DETERMINANTS OF HEALTH     At the [facility], we have learned that life circumstances - like trouble affording food, housing, utilities, or transportation can affect the health of many of our patients.   That is why we wanted to ask: are you currently experiencing any life circumstances that are negatively impacting your health and/or quality of life? Patient declined to answer    Social Determinants of Health     Financial Resource Strain: Low Risk  (03/20/2021)    Overall Financial Resource Strain (CARDIA)     Difficulty of Paying Living Expenses: Not hard at all   Internet Connectivity: Not on file   Food Insecurity: No Food Insecurity (03/20/2021)    Hunger Vital Sign     Worried About Running Out of Food in the Last Year: Never true     Ran Out of Food in the Last Year: Never true   Tobacco Use: Low Risk  (12/09/2021)    Patient History     Smoking Tobacco Use: Never     Smokeless Tobacco Use: Never     Passive Exposure: Never   Housing/Utilities: Low Risk  (03/20/2021)    Housing/Utilities     Within the past 12 months, have you ever stayed: outside, in a car, in a tent, in an overnight shelter, or temporarily in someone else's home (i.e. couch-surfing)?: No     Are you worried about losing your housing?: No     Within the past 12 months, have you been unable to get utilities (heat, electricity) when it was really needed?: No   Alcohol Use: Not At Risk (09/17/2021)    Alcohol Use     How often do you have a drink containing alcohol?: 2 - 4 times per month     How many drinks containing alcohol do you have on a typical day when you are drinking?: 1 - 2     How often do you have 5 or more drinks on one occasion?: Never   Transportation Needs: No Transportation Needs (03/20/2021)    PRAPARE - Transportation     Lack of Transportation (Medical): No     Lack of Transportation (Non-Medical): No   Substance Use: Low Risk  (09/17/2021)  Substance Use     Taken prescription drugs for non-medical reasons: Never     Taken illegal drugs: Never     Patient indicated they have taken drugs in the past year for non-medical reasons: Yes, [positive answer(s)]: Not on file   Health Literacy: Not on file   Physical Activity: Not on file   Interpersonal Safety: Not on file   Stress: No Stress Concern Present (09/26/2019)    [employer] of Occupational Health - Occupational Stress Questionnaire     Feeling of Stress : Not at all   Intimate Partner Violence: Not on file   Depression: At risk (07/30/2020)    PHQ-2     PHQ-2 Score: 4   Social Connections: Not on file       Would you be willing to receive help with any of the needs that you have identified today? Not applicable       SHIPPING     Specialty Medication(s) to be Shipped:   Inflammatory Disorders: Cosentyx    Other medication(s) to be shipped: No additional medications requested for fill at this time     Changes to insurance: No    Delivery Scheduled: Yes, Expected medication delivery date: 12/6.     Medication will be delivered via UPS to the confirmed prescription address in [facility].    The patient will receive a drug information handout for each medication shipped and additional FDA Medication Guides as required.  Verified that patient has previously received a Conservation officer, historic buildings and a Surveyor, mining.    The patient or caregiver noted above participated in the development of this care plan and knows that they can request review of or adjustments to the care plan at any time.      All of the patient's questions and concerns have been addressed.    Julianne Rice, PharmD   [facility] Specialty Pharmacist

## 2022-01-12 MED FILL — COSENTYX PEN 150 MG/ML SUBCUTANEOUS: SUBCUTANEOUS | 28 days supply | Qty: 1 | Fill #0

## 2022-01-25 ENCOUNTER — Ambulatory Visit
Admit: 2022-01-25 | Discharge: 2022-01-26 | Payer: PRIVATE HEALTH INSURANCE | Attending: Student in an Organized Health Care Education/Training Program | Primary: Student in an Organized Health Care Education/Training Program

## 2022-01-25 DIAGNOSIS — B349 Viral infection, unspecified: Principal | ICD-10-CM

## 2022-01-25 NOTE — Unmapped (Signed)
James Singh  December 19, 1990    Assessment & Plan:  Viral illness  -     RAPID INFLUENZA/RSV/COVID PCR      31yo male presents today for acute URI symptoms. Symptom onset since 01/20/2022, less than a week ago. He has had Flu and COVID before but has tested negative twice for COVID at home (on 12/13 and 12/15).   He has tried some OTC therapies with mild relief. His symptoms do appear viral in etiology and notes that he has remained afebrile.   COVID/Flu/RSV tested today. He appears high risk for bacterial infections given PMH and current medications; however, no indication for CXR, abx or steroids at this time.     Further plan pending viral testing. Provided resources on additional OTC therapies to try and encouraged PO intake.   Deferred Pneumococcal vaccine given acute illness    To keep appointment with PCP on 03/22/22    No follow-ups on file.    Subjective:  Subjective    HPI: James Singh is a 31 y.o. male here for URI     Onset: since last Wednesday (01/20/2022)   Symptoms: cough (productive-green in color), congestion, low appetite, nausea, no vomiting or diarrhea or fevers, denies rashes, he has had muscle aches, denies shortness of breath, no dysuria.      He is only drinking 2 bottles of water a day (around a Liter), really not eating much   Exposure: denies sick exposure  He was in [place] the weekend before he got sick (around 01/16/2022) but denies sick contacts there. He has not been around children.     Treatments: Mucinex-DM, cough drops, hot shower  He reports having flu/covid before.   Home COVID on 01/20/2022 and 01/22/2022        ROS:  Review of systems negative unless otherwise noted as per HPI.    Objective:  Objective    Vitals:    01/25/22 1057   BP: 140/96   Pulse: 80   Temp: 36.4 ??C (97.6 ??F)   SpO2: 97%     Body mass index is 33.5 kg/m??.    Wt Readings from Last 3 Encounters:   01/25/22 (!) 118.3 kg (260 lb 14.4 oz)   12/09/21 (!) 120 kg (264 lb 9.6 oz)   10/15/21 (!) 118.7 kg (261 lb 9.6 oz) Physical Exam:  Const: appears ill but not toxic appearing or in distress  HEENT: Nasal congestion present. Noted to have small cyst on right side of neck, not tender to palpation--roughly 1cm in size. No cervical adenopathy noted  Cardiac: RRR  Lungs: CTAB  Skin: Good Skin tugor but appeared mildly diaphoretic   Psych: normal affect   Neuro: AOx4

## 2022-01-25 NOTE — Unmapped (Signed)
RESPIRATORY INFECTIONS:   It is important to be very cautious with use of antibiotics.  Inappropriate use of antibiotics increases the risk of harm to you and to other patients.       At least 90% of Sinus infections and Acute Bronchitis infections are due to viruses.  While the symptoms of viral infections can be just as miserable as bacterial infections, antibiotics do not work for viral infections.     Signs of POTENTIAL bacterial infection:   Persistent fever and thick green mucus that last for more than 10 days   Severe symptoms with facial pain, fever, thick green mucus for more than 3 days   Persistent cough with high fever   Respiratory illness in the setting of COPD or Emphysema     In the absence of these, is likely that your symptoms are due to a virus or allergies, thus antibiotics will not be effective.       Potential harms of antibiotics:   Antibiotic Resistance (colonization with bacteria that are not susceptible to commonly used antibiotics - this can lead to more serious infections for you and others in the future)   Unwanted Side Effects - nausea, diarrhea, heartburn, yeast infection, rash   C. Difficile infection (a serious antibiotic-associated diarrhea that can require hospitalization and be fatal)   Allergic Reaction - from rash to severe reactions including anaphylaxis, which can be fatal   Sudden Liver and Kidney failure - while rare, these can occur even with the most common antibiotics and are quite serious and potentially life-threatening     You should call back or make an appointment if you:   1) Suspect bacterial infection   2) Suspect influenza virus (for which we can treat if seen within 48 hours) - high fever (102 +), chills, body aches, cough, extreme fatigue, or   3) Have shortness of breath or wheezing   4) Have cough/cold symptoms that are not improving after 10-14 days (typical course for viral illness)     Symptom Treatment:     For drainage (nasal or throat), take an antihistamine such as:   - Claritin (Loratadine) 10 mg daily  OR   - Zyrtec (Cetirizine) 10 mg nightly  OR   - Allegra (Fexofenadine) 180 mg daily   *Do not take these if you have an enlarged prostate     For nasal stuffiness, try nasal steroid spray:   - Nasacort 1 spray each nostril twice daily as needed OR   - Flonase 1 spray each nostril twice daily as needed     For severe nasal stuffiness, you may try Affrin nasal spray twice daily for no longer than 3 days.     For congestion (head, chest, or sinus), take PLAIN Mucinex ER (Guaifenesin) 1200 mg twice daily.       For sinus congestion, try a sinus rinse (with sterile water).  Be sure to follow package instructions.     - If you do not have high blood pressure or heart rhythm issues, you may also try Sudafed for a few days.  It may cause palpitations and trouble sleeping.     For sore throat:   - gargle with warm salt water   - Vicks Chloraseptic spray   - throat lozenges     For cough suppression (if you don't have high blood pressure):   - 12 hour Delsym (dextromethoraphan)       Natural Remedies (Avoid if you are taking Coumadin / Warfarin):   -  Zinc lozenges 8-12 mg every 2-3 hours while awake (i.e. Elderberry Zinc lozenges by Ma Rings)   - Must be started within 24-48 hours of symptom onset   - Don't use Zinc nasal spray - can cause loss of smell   - Can cause mouth lesions, stomach upset, and copper deficiency   - Pelargonium Sidoides (i.e. Nature's Way Umcka Coldcare in lozenge or as extract)   - Can reduce cold symptoms and duration in adults and kids greater than 2 years   - minimal side effects   - Echinacea purpurae (i.e. Nature's Way, Bioforce)   - Must be started in first 1-2 days of symptom onset   - Can reduce duration of cold symptoms in adults   - Must take high enough dose and get uncontaminated product   - *DO NOT take if you have asthma, allergies, HIV, or autoimmune disease   - Vitamin C   - May decrease duration of cold symptoms in adults and children   - Dose of at least 200 mg daily   - May prevent colds in athletes and those exposed to extreme cold temperatures   - Honey (Pure Honey, Buckwheat if possible)   - 1 teaspoon 30 minutes before bed helps decrease frequency of cough   - May be better than benadryl and dextromethorphan   - *SHOULD NEVER be given to children less than 49 months of age     *There are prescription medications that are not antibiotics that may help with some of your symptoms if over-the-counter or natural remedies are not helping enough

## 2022-01-28 MED ORDER — PREDNISONE 5 MG TABLET
ORAL_TABLET | ORAL | 0 refills | 12 days | Status: CP
Start: 2022-01-28 — End: 2022-02-09

## 2022-01-29 NOTE — Unmapped (Addendum)
Called patient back regarding his urgent page. He reports L ankle pain, swelling, and warmth since this morning. He denies fever, chills, or other new symptoms. He has been taking methotrexate 15 mg weekly and cosentyx was added 6 weeks ago. He feels that his symptoms were overall well controlled on this regimen until this flare. He has not tried any PRN medications for the pain yet. Avoids NSAIDs due to GI ulcer.     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 Unsure if this is a flare of his inflammatory arthritis or possible gout, but he reports responding well to prednisone with similar flares in the past. Infection on the differential with monoarticular joint pain, but seems less likely as he describes it to similar flares in the past (had L ankle flare at clinic visit 12/2021 also) and denies systemic symptoms. We discussed ER precautions. Discussed possible aspiration, but patient would like to avoid that.  Prescribed short prednisone taper and will update his primary rheumatologist tomorrow AM.

## 2022-01-30 IMAGING — CR DG CHEST 2V
2 series · 2 of 2 positions shown · non-contrast
Comparison: November 02, 2013

CLINICAL DATA: Chest pain and shortness of breath

EXAM:
CHEST - 2 VIEW

[w chest lat]
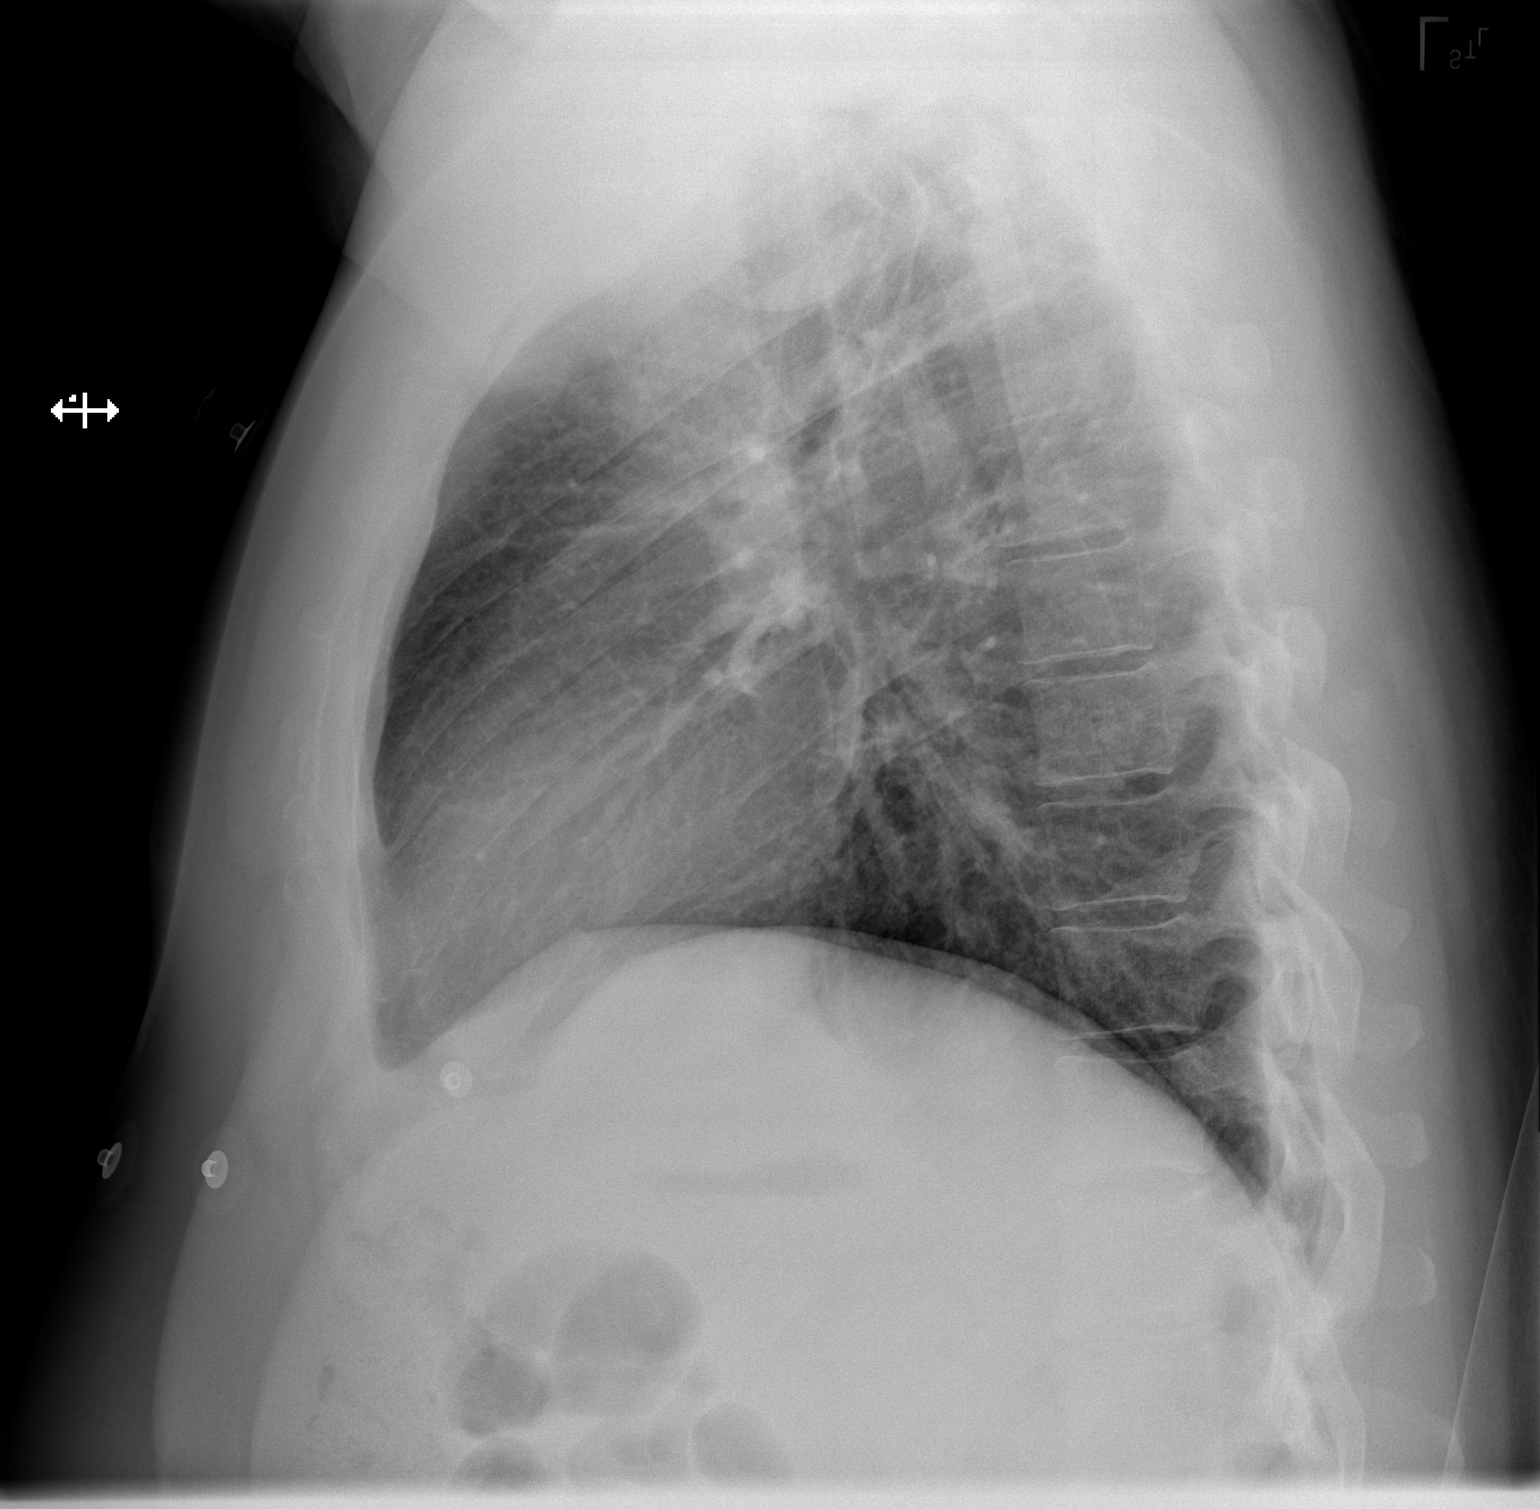

[x chest ap]
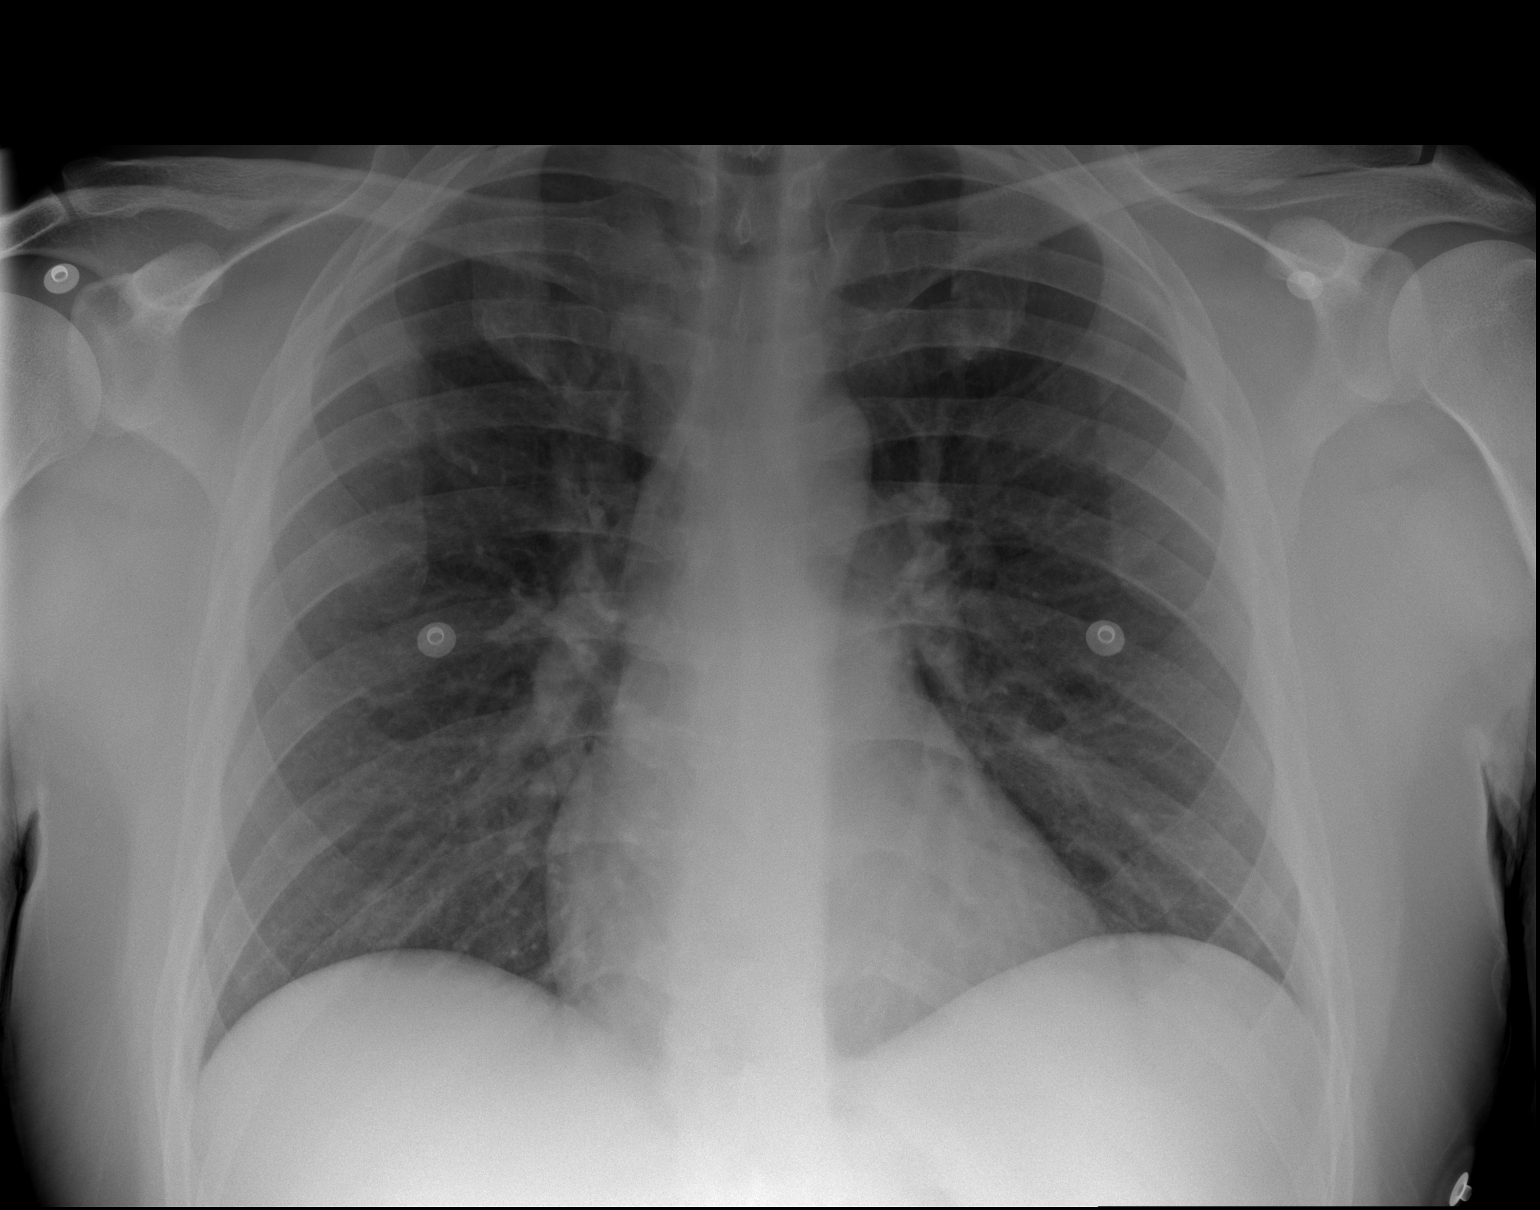

[2 of 2 positions shown; findings below may reference images not displayed]

FINDINGS: Lungs are clear. Heart size and pulmonary vascularity are normal. No
adenopathy. No pneumothorax. No bone lesions.
IMPRESSION: Lungs clear.  Heart size normal.

## 2022-02-05 ENCOUNTER — Ambulatory Visit
Admit: 2022-02-05 | Discharge: 2022-02-06 | Payer: PRIVATE HEALTH INSURANCE | Attending: Student in an Organized Health Care Education/Training Program | Primary: Student in an Organized Health Care Education/Training Program

## 2022-02-05 DIAGNOSIS — R051 Acute cough: Principal | ICD-10-CM

## 2022-02-05 MED ORDER — BENZONATATE 200 MG CAPSULE
ORAL_CAPSULE | Freq: Three times a day (TID) | ORAL | 0 refills | 7 days | Status: CP | PRN
Start: 2022-02-05 — End: 2022-02-12

## 2022-02-05 MED ORDER — FAMOTIDINE 20 MG TABLET
ORAL_TABLET | Freq: Every day | ORAL | 0 refills | 30 days | Status: CP
Start: 2022-02-05 — End: 2022-03-07

## 2022-02-05 NOTE — Unmapped (Signed)
James Singh  [birth year]    Assessment & Plan:  Acute cough  -     RAPID INFLUENZA/RSV/COVID PCR  -     XR Chest 2 views; Future  -     benzonatate; Take 1 capsule (200 mg total) by mouth Three (3) times a day as needed for cough for up to 7 days.  -     famotidine; Take 1 tablet (20 mg total) by mouth in the morning.      31 yo male presents today for persistent cough.   He has had this cough for around 16 days, appears might be developing bronchitis. His cough is not getting worse but it is not improving. On exam today however, his lungs continue to sound clear on exam.   He was rechecked for Flu/COVID/RSV and will obtain CXR.   For cough, will provide tessalon pearls (as appeared to be one of the only things covered by insurance). Will also start Famotidine to assess if possible GERD causing cough. To continue OTC therapies/conservative therapies as well.       No follow-ups on file.    Subjective:  Subjective    HPI: James Singh is a 31 y.o. male here for cough.   He was seen on 01/25/2022 for cough which did appear to be secondary to viral infection. At that time Flu/COVID/RSV were checked and he was negative. His lungs were clear at that time and conservative therapies/supportive care was recommended.     However, he presents today as his cough continues. His cough is not getting worse but it is not getting better. He still has mucous production but it is not as green.   Endorses nasal congestion.   Denies shortness of breath, chest pain, fever/chills/night sweats, diarrhea, dysphagia   His appetite is a little better and he has been drinking more water   He is getting sleep but having some difficulty due to cough.   Treatments tried: mucinex           ROS:  Review of systems negative unless otherwise noted as per HPI.    Objective:  Objective    Vitals:    02/05/22 1425   BP: 129/87   Pulse: 68   Temp: 36.2 ??C (97.1 ??F)   SpO2: 98%     Body mass index is 34.55 kg/m??.    Physical Exam:  Const: In no acute distress, not toxic or ill appearing   HEENT: wearing face mask, mild nasal congestion is present on exam   Cardiac: RRR  Pulm: he lungs appear clear on today's exam, there is no wheeze/rhonchi/rales noted. No conversational dyspnea or increased work of breathing. He did have an episode of dry coughing during exam   Skin: no rashes on exposed skin   Psych: mood normal   Neuro: AOX3

## 2022-02-05 NOTE — Unmapped (Signed)
RESPIRATORY INFECTIONS:   It is important to be very cautious with use of antibiotics.  Inappropriate use of antibiotics increases the risk of harm to you and to other patients.       At least 90% of Sinus infections and Acute Bronchitis infections are due to viruses.  While the symptoms of viral infections can be just as miserable as bacterial infections, antibiotics do not work for viral infections.     Signs of POTENTIAL bacterial infection:   Persistent fever and thick green mucus that last for more than 10 days   Severe symptoms with facial pain, fever, thick green mucus for more than 3 days   Persistent cough with high fever   Respiratory illness in the setting of COPD or Emphysema     In the absence of these, is likely that your symptoms are due to a virus or allergies, thus antibiotics will not be effective.       Potential harms of antibiotics:   Antibiotic Resistance (colonization with bacteria that are not susceptible to commonly used antibiotics - this can lead to more serious infections for you and others in the future)   Unwanted Side Effects - nausea, diarrhea, heartburn, yeast infection, rash   C. Difficile infection (a serious antibiotic-associated diarrhea that can require hospitalization and be fatal)   Allergic Reaction - from rash to severe reactions including anaphylaxis, which can be fatal   Sudden Liver and Kidney failure - while rare, these can occur even with the most common antibiotics and are quite serious and potentially life-threatening     You should call back or make an appointment if you:   1) Suspect bacterial infection   2) Suspect influenza virus (for which we can treat if seen within 48 hours) - high fever (102 +), chills, body aches, cough, extreme fatigue, or   3) Have shortness of breath or wheezing   4) Have cough/cold symptoms that are not improving after 10-14 days (typical course for viral illness)     Symptom Treatment:     For drainage (nasal or throat), take an antihistamine such as:   - Claritin (Loratadine) 10 mg daily  OR   - Zyrtec (Cetirizine) 10 mg nightly  OR   - Allegra (Fexofenadine) 180 mg daily   *Do not take these if you have an enlarged prostate     For nasal stuffiness, try nasal steroid spray:   - Nasacort 1 spray each nostril twice daily as needed OR   - Flonase 1 spray each nostril twice daily as needed     For severe nasal stuffiness, you may try Affrin nasal spray twice daily for no longer than 3 days.     For congestion (head, chest, or sinus), take PLAIN Mucinex ER (Guaifenesin) 1200 mg twice daily.       For sinus congestion, try a sinus rinse (with sterile water).  Be sure to follow package instructions.     - If you do not have high blood pressure or heart rhythm issues, you may also try Sudafed for a few days.  It may cause palpitations and trouble sleeping.     For sore throat:   - gargle with warm salt water   - Vicks Chloraseptic spray   - throat lozenges     For cough suppression (if you don't have high blood pressure):   - 12 hour Delsym (dextromethoraphan)       Natural Remedies (Avoid if you are taking Coumadin / Warfarin):   -  Zinc lozenges 8-12 mg every 2-3 hours while awake (i.e. Elderberry Zinc lozenges by Ma Rings)   - Must be started within 24-48 hours of symptom onset   - Don't use Zinc nasal spray - can cause loss of smell   - Can cause mouth lesions, stomach upset, and copper deficiency   - Pelargonium Sidoides (i.e. Nature's Way Umcka Coldcare in lozenge or as extract)   - Can reduce cold symptoms and duration in adults and kids greater than 2 years   - minimal side effects   - Echinacea purpurae (i.e. Nature's Way, Bioforce)   - Must be started in first 1-2 days of symptom onset   - Can reduce duration of cold symptoms in adults   - Must take high enough dose and get uncontaminated product   - *DO NOT take if you have asthma, allergies, HIV, or autoimmune disease   - Vitamin C   - May decrease duration of cold symptoms in adults and children   - Dose of at least 200 mg daily   - May prevent colds in athletes and those exposed to extreme cold temperatures   - Honey (Pure Honey, Buckwheat if possible)   - 1 teaspoon 30 minutes before bed helps decrease frequency of cough   - May be better than benadryl and dextromethorphan   - *SHOULD NEVER be given to children less than 47 months of age     *There are prescription medications that are not antibiotics that may help with some of your symptoms if over-the-counter or natural remedies are not helping enough

## 2022-02-09 ENCOUNTER — Ambulatory Visit: Admit: 2022-02-09 | Discharge: 2022-02-09 | Payer: PRIVATE HEALTH INSURANCE

## 2022-02-11 NOTE — Unmapped (Signed)
[facility] Refill Coordination Note    James Singh, DOB: [birth year]  Phone: [phone] (home) [phone] (work)      All above HIPAA information was verified with patient.         02/10/2022     6:48 PM   Specialty Rx Medication Refill Questionnaire   Which Medications would you like refilled and shipped? Cosentyx   Please list all current allergies: sulfa, pollen, weeds, dust mites   Have you missed any doses in the last 30 days? No   Have you had any changes to your medication(s) since your last refill? No   How many days remaining of each medication do you have at home? 0   If receiving an injectable medication, next injection date is 02/15/2022   Have you experienced any side effects in the last 30 days? No   Please enter the full address (street address, city, state, zip code) where you would like your medication(s) to be delivered to. [address] [place], [place] [address]   Please specify on which day you would like your medication(s) to arrive. Note: if you need your medication(s) within 3 days, please call the pharmacy to schedule your order at [phone]  02/12/2022   Has your insurance changed since your last refill? No   Would you like a pharmacist to call you to discuss your medication(s)? No   Do you require a signature for your package? (Note: if we are billing Medicare Part B or your order contains a controlled substance, we will require a signature) No         Completed refill call assessment today to schedule patient's medication shipment from the [facility] [phone]).  All relevant notes have been reviewed.       Confirmed patient received a Conservation officer, historic buildings and a Surveyor, mining with first shipment. The patient will receive a drug information handout for each medication shipped and additional FDA Medication Guides as required.         REFERRAL TO PHARMACIST     Referral to the pharmacist: Not needed      [facility]     Shipping address confirmed in Epic.     Delivery Scheduled: Yes, Expected medication delivery date: 02/16/22. (Confirmed with patient via phone)    Medication will be delivered via UPS to the prescription address in Epic WAM.    Camillo Flaming, PharmD   [facility] Specialty Pharmacist

## 2022-02-15 MED FILL — COSENTYX PEN 150 MG/ML SUBCUTANEOUS: SUBCUTANEOUS | 28 days supply | Qty: 1 | Fill #1

## 2022-02-22 IMAGING — CR DG KNEE COMPLETE 4+V*R*
4 series · 4 of 4 positions shown · non-contrast
Comparison: None.

CLINICAL DATA: Right knee pain and swelling for 3 days. History of
rheumatoid arthritis.

EXAM:
RIGHT KNEE - COMPLETE 4+ VIEW

[knee ap]
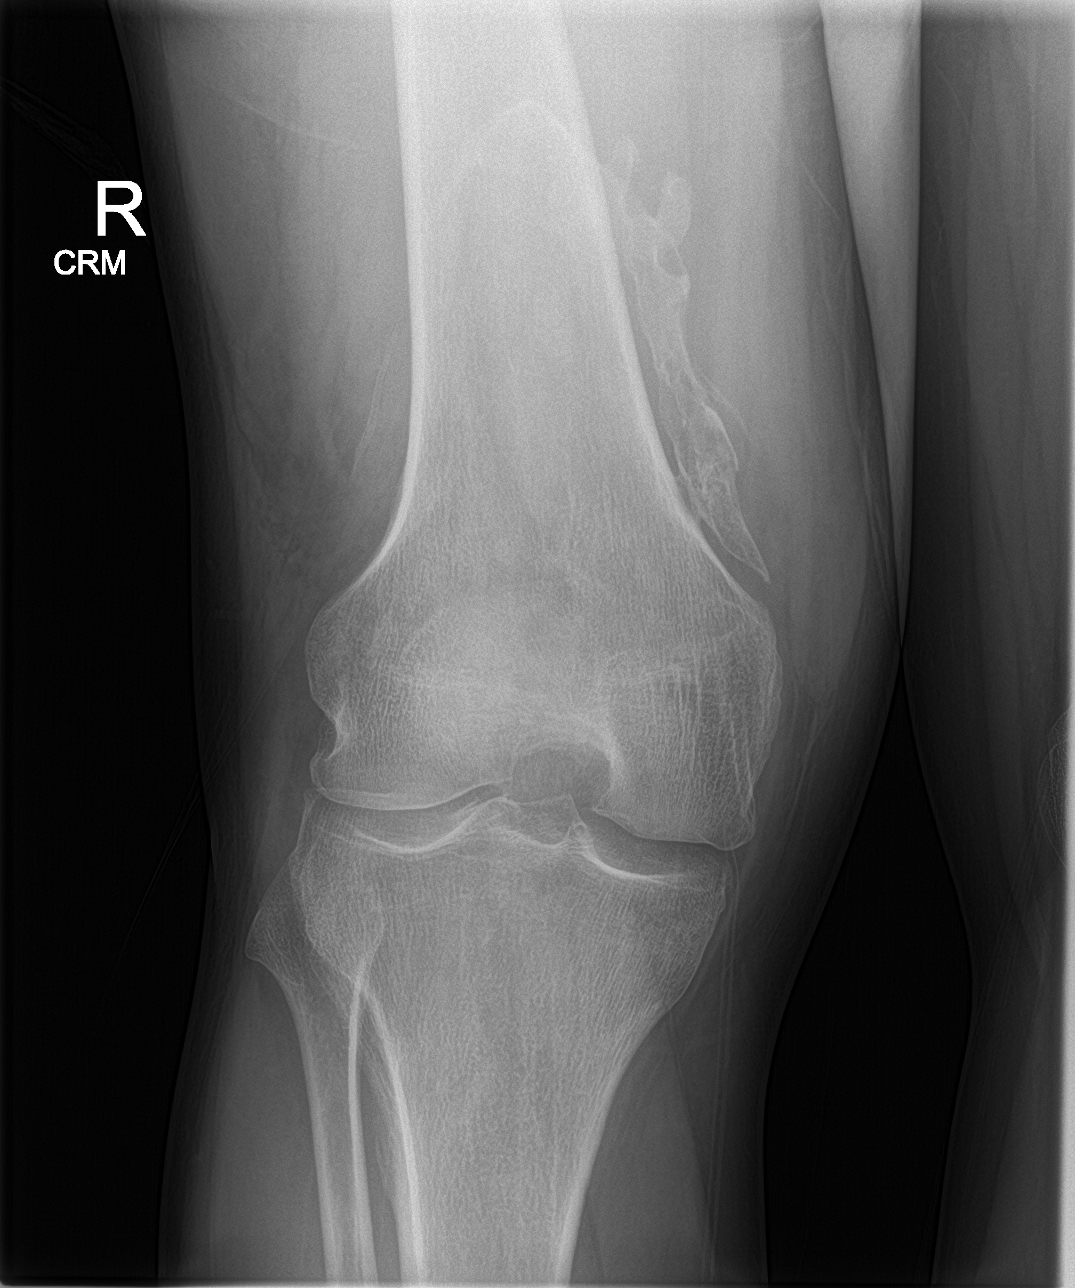

[knee lat]
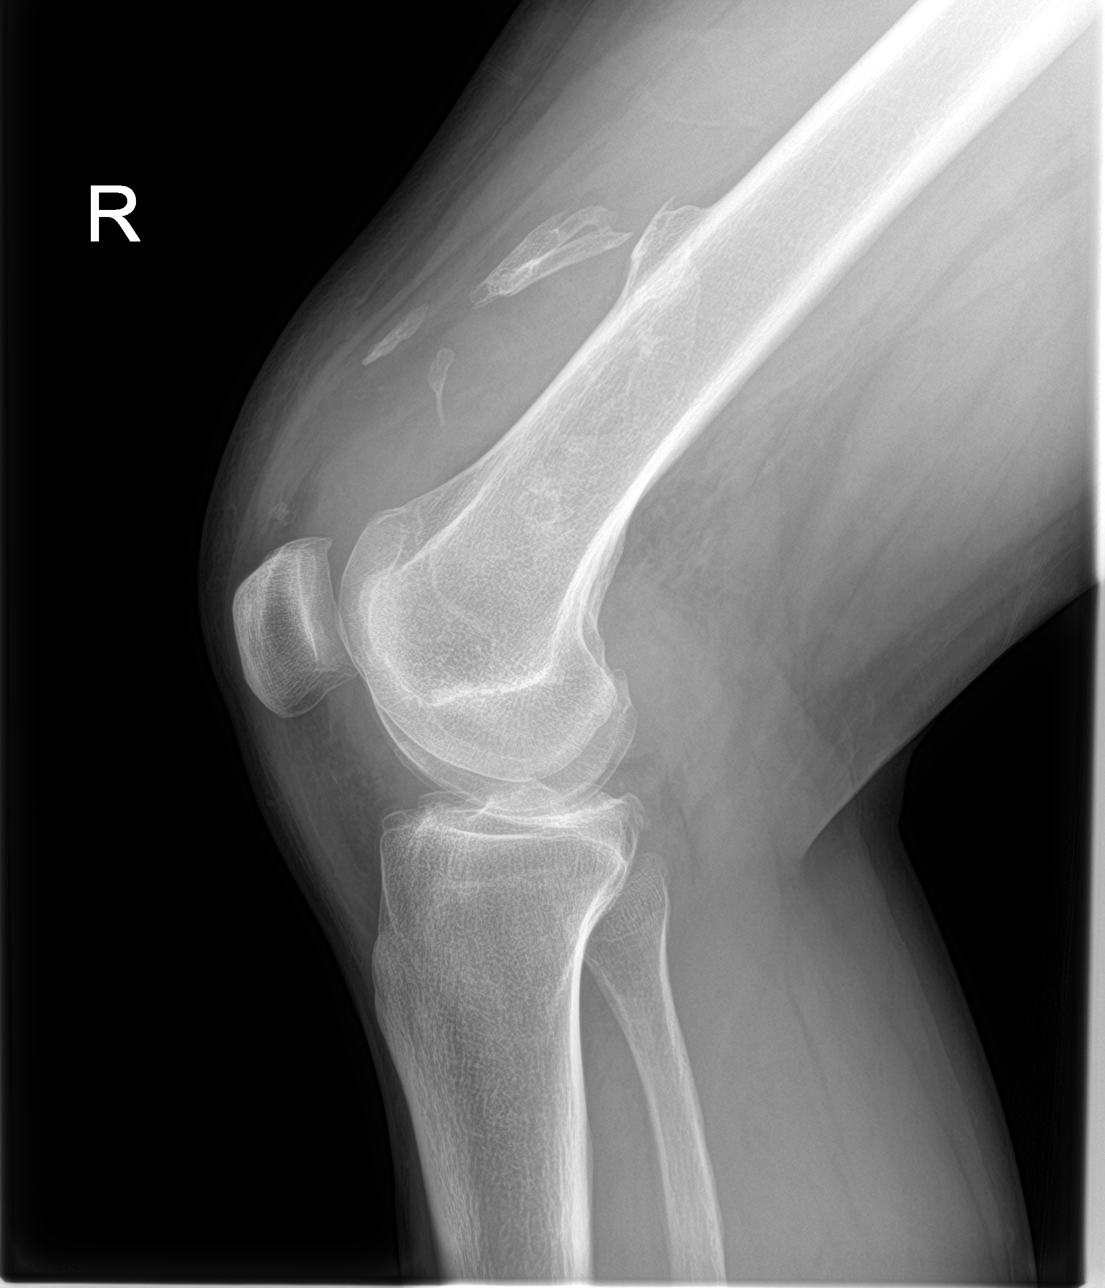

[knee obl (1 of 2)]
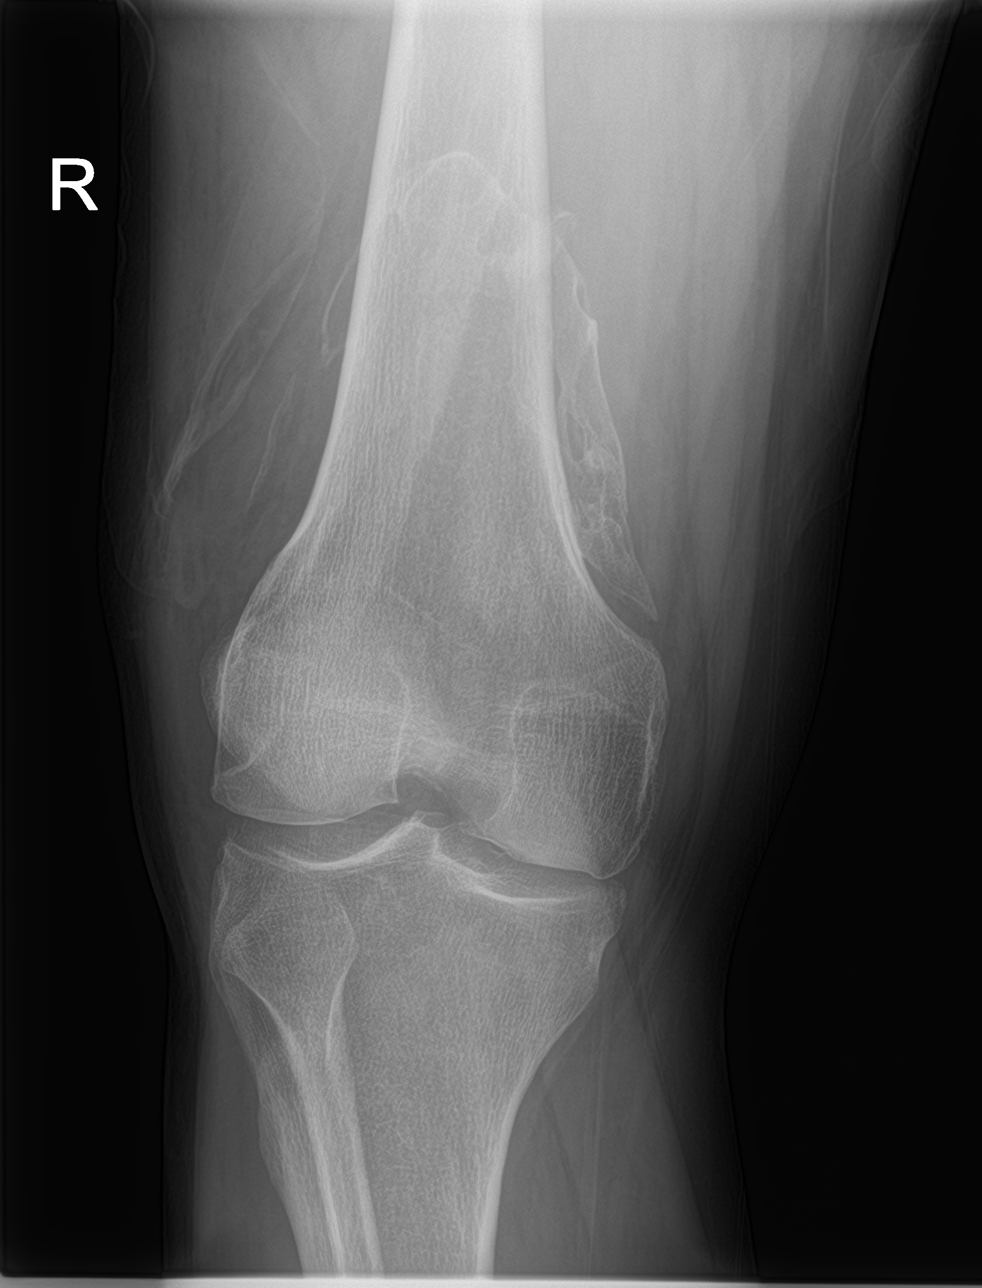

[knee obl (2 of 2)]
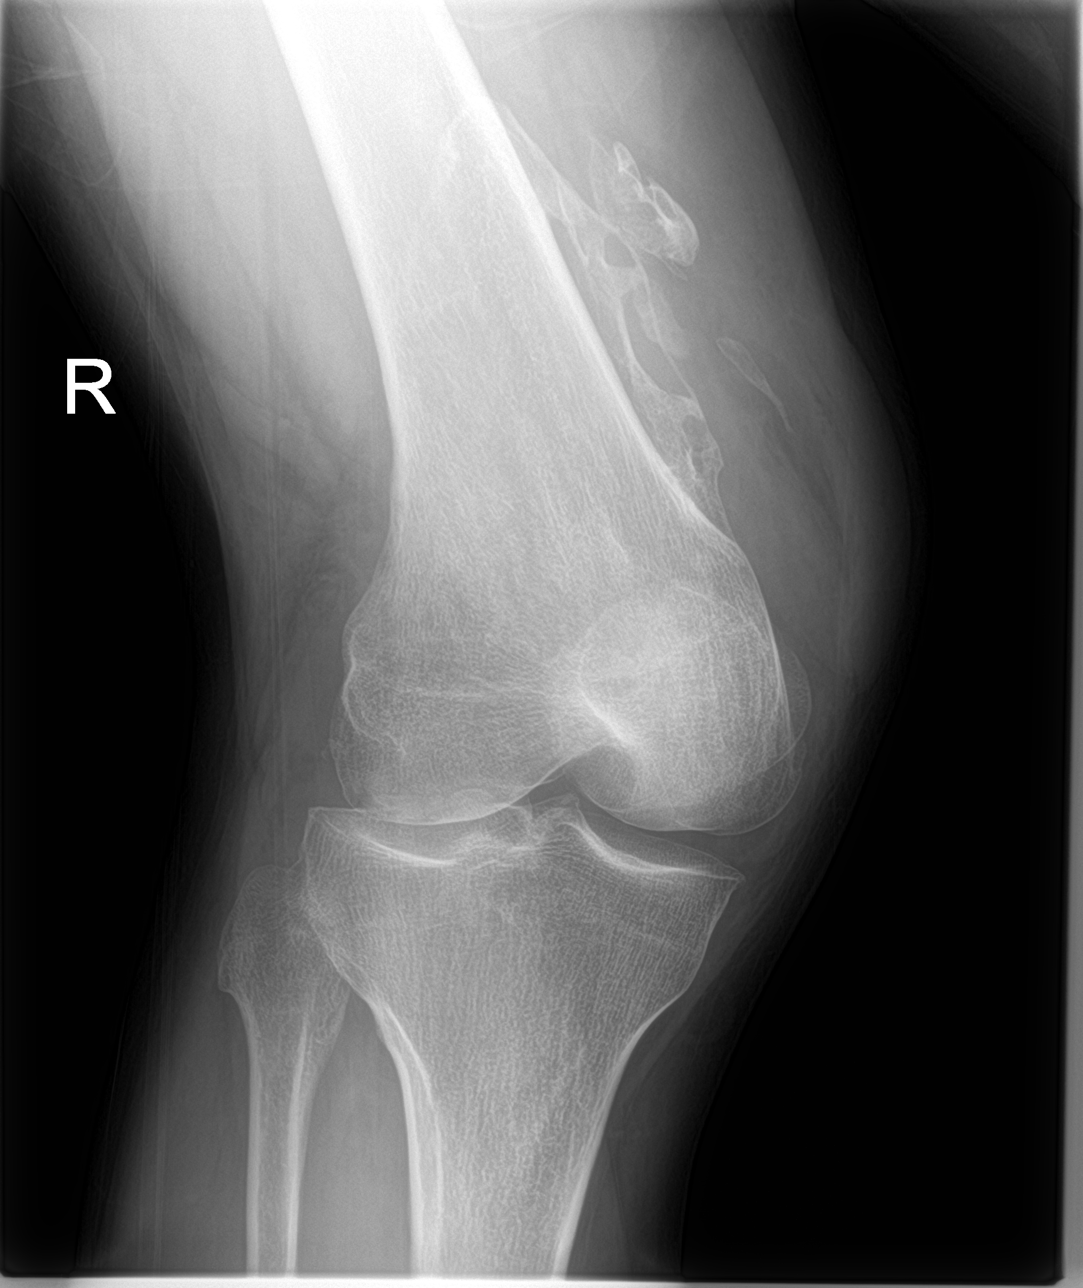

[4 of 4 positions shown; findings below may reference images not displayed]

FINDINGS: No fracture or dislocation is identified. The patient has a moderate
to moderately large knee joint effusion. Small osteophytes are
present about the knee. Osseous structures along the anteromedial
aspect of the distal femur are consistent with myositis ossific cans
or heterotopic ossification.
IMPRESSION: Negative for acute bony abnormality.

Myositis ossific cans or heterotopic ossification along the
anteromedial distal femur are likely due to remote trauma.

Moderate to moderately large knee joint effusion.

## 2022-02-23 ENCOUNTER — Ambulatory Visit
Admit: 2022-02-23 | Discharge: 2022-02-24 | Payer: PRIVATE HEALTH INSURANCE | Attending: Student in an Organized Health Care Education/Training Program | Primary: Student in an Organized Health Care Education/Training Program

## 2022-02-23 DIAGNOSIS — M1380 Other specified arthritis, unspecified site: Principal | ICD-10-CM

## 2022-02-23 MED ADMIN — lidocaine (XYLOCAINE) 10 mg/mL (1 %) injection 2 mL: 2 mL | INTRADERMAL | @ 22:00:00 | Stop: 2022-02-23

## 2022-02-23 NOTE — Unmapped (Signed)
Patient information following joint aspiration/injection    What is an aspiration and/or injection?    Aspiration and injection refer to the placement of a needle into a joint cavity or bursa to remove fluid for analysis or study, and to inject medicine for pain relief or treatment of a disease (rheumatoid arthritis, gout, pseudogout, infectious arthritis, others). Sometimes the doctor may only remove some of the joint fluid to establish a diagnosis (or cause) for the collection of fluid. In other cases, such as in patients with chronic osteoarthritis, the doctor may only inject medicine to provide pain relief.    Are there risks with needle aspiration and injection?    The risks of this procedure are few. Introducing infection into the joint is uncommon, occurring in less than 0.01 percent of patients. Rarely, the needle tip may damage the cartilage surface inside the joint. The greatest risk of injury to the joint appears to be associated with placing too much medicine in the joint or administering the medicine too often.    Is the procedure painful?    Most people find the procedure tolerable. However, the procedure can hurt if the needle touches bone or an inflamed tissue. Your doctor will try to avoid these surfaces, but sometimes this cannot be prevented. If you feel discomfort, it will generally be brief. This procedure is most commonly performed without an anesthetic (numbing medicine). Pain occurring hours after an injection into a joint can develop from the crystals of medicine that are inserted and may be prevented by taking an anti-inflammatory medicine such as ibuprofen (brand names: Advil, Motrin, Nuprin).  Icing the area for 10 minutes every few hours after the procedure may also be helpful.    If I had a large amount of fluid removed from the joint, can it come back?    Yes. A collection of fluid within the joint or bursa is called an effusion. It is common for an effusion to recur after removal of a large amount of fluid. Some experts recommend that an elastic (ACE) wrap be placed over the joint after removal of large amounts of fluid.    If medicine is injected, how long will it be effective?    The response to medicine injected into the joint is highly variable. Some people get lasting relief, whereas others may only notice improvement for days to weeks. Call your doctor if the pain returns quickly.        Following Joint Aspiration and Injection    *A bandage was placed over the needle insertion site. You can remove it at any time.    *One complication of a joint injection is postinjection flare. This is a rare inflammatory reaction to the medicine placed in the joint. The reaction produces intense pain, beginning about 6 to 12 hours after the injection. Patients usually complain that their pain is worse than it was before the injection was performed. To blunt or eliminate this reaction, take ibuprofen, three 200-mg tablets three times a day, for at least the first two days after the injection.    *If a large amount of fluid was removed from the joint, you may be asked to wear an elastic (ACE) wrap over the site. The wrap is applied to create support and pressure that may reduce the tendency for the fluid to come back. Do not wrap the ACE wrap so tightly that the extremity becomes numb or looks blue.    *If laboratory studies were performed on the joint fluid,   the results should come back in the next few days. Your doctor's office will contact you with the results. Please keep any scheduled follow-up appointments with your doctor.    *Infection in the joint is uncommon after joint aspiration and injection. If the joint becomes red, warm, or tender, or if you develop a fever in the first few days after the procedure, please call your doctor.    *If your joint was injected with medicine and local anesthetic, it may be numb for several hours after the procedure. Avoid heavy exercise or placing excessive strain on the injected joint (e.g., carrying children or heavy objects) for the first two weeks after the procedure. Jumping up and down, for instance, can place a heavy load on an anesthetized joint and should be avoided.    *Joint pain may slowly return after an injection. Some people receive long-term pain relief, but for most people the pain gradually returns. Continue your long-term medicine for the joint as prescribed by your doctor.    During regular hours, for any problems directly related to the injection, please call [phone] and ask for Dr. Nelson.  For other issues, please contact your primary rheumatologist.    After hours or on weekends, please call [phone] and ask to be connected to the  on-call rheumatologist.

## 2022-02-23 NOTE — Unmapped (Signed)
[facility] Follow-up Note     Assessment/Plan:    James Singh is a 32 y.o.  male with a PMHx of seronegative peripheral inflammatory arthritis (hips, knees, jaw), disc disease of the cervical spine, and pontine cavernous malformation who presents today for follow-up.  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 He had been controlled on Humira, and then had a flare while on Actemra in July 2022 so switched to James Singh. Due to inadequate control, James Singh was switched to Cosentxy (first started 12/2021). He is currently on MTX 15mg  weekly and Cosentyx.    Today, L ankle with mild synovitis by exam/POC today. Aspirated ankle today for evaluation of gout given persistent LE monoarthritis. Otherwise will plan to continue current DMARDs.    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    James Emmer, MD   Assistant Professor of Singh  James Singh  James Singh    James Singh: James Senters, FNP    HPI:  James Singh is a 32 y.o.  male with a PMHx of seronegative peripheral inflammatory arthritis (hips, knees, jaw), disc disease of the cervical spine, and pontine cavernous malformation who presents today for follow-up.    Today  Coming in today for follow up on Cosentyx. Unfortunately continues to have linger L ankle issues and flares. Got 1 round of prednisone for this with significant improvement but symptoms returned after discontinuation. Feels it is coming on now. Tolerating Cosentyx well. With regard to his other joints, he feels the Cosentyx is working quite well and is better than the James Singh.    Disease History  James Singh developed inflammatory arthritis of the R knee and left hip in late 2013.  He was placed on steroids, with some improvement.  However, he flared during tapers and was subsequently placed on methotrexate.  Due to persistent hip pain despite MTX, he was started on Humira in June 2014.   The patient has a history of terminal ileal ulcer in 2013 and has followed with GI in the past. Biopsy was consistent with acute inflammation, not IBD.   In regard to his seronegative arthritis, he is on Humira 40mg  SQ q2 week, MTX 20mg  SQ qweek, and daily folic. He flared in 2018 when trying to taper MTX.  MTX was decreased to 15mg  weekly in Sept 2020 due to LFT elevated thought to be related to NASH and Tylenol use. Humira was stopped in Nov 2021 when he developed cutaneous leukocytoclastic vasculitis.  Actemra was started in January 2022 as an alternative biologic therapy. He had a flare in July 2022 with R knee & L hip involvement and was switched to James Singh.     Review of Systems:  Positive findings noted above, otherwise a 14 point review of systems was reviewed and negative    Past Medical,  Surgical, Family and Social History reviewed and updated per EMR     Allergies:  Sulfa (sulfonamide antibiotics), Sulfacetamide sodium, Sulfamethoxazole-trimethoprim, Bee pollen, Dog dander, Dog epithelium allergenic extract, Mite extract, and Pollens extract    Medications:     Current Outpatient Medications:     colchicine (COLCRYS) 0.6 mg tablet, TAKE 1 TABLET BY MOUTH EVERY DAY, Disp: 90 tablet, Rfl: 1    empty container Misc, USE AS DIRECTED, Disp: 1 each, Rfl: 2    EPINEPHrine (EPIPEN) 0.3 mg/0.3 mL injection, , Disp: , Rfl:     famotidine (PEPCID) 20 MG tablet, Take 1 tablet (20 mg total) by mouth in the morning., Disp: 30 tablet, Rfl: 0    folic acid (FOLVITE) 1 MG tablet, Take 1 tablet (1 mg total) by mouth daily., Disp: 30 tablet, Rfl: 11    methotrexate 2.5 MG tablet, TAKE 6 TABLETS BY MOUTH ONE TIME PER WEEK, Disp: 72 tablet, Rfl: 0    minocycline (MINOCIN) 50 MG capsule, , Disp: , Rfl:     secukinumab (COSENTYX PEN) 150 mg/mL PnIj injection, Inject 1 mL (150 mg total) under the skin once a week for 5 weeks. Loading dose, Disp: 5 mL, Rfl: 0    secukinumab (COSENTYX PEN) 150 mg/mL PnIj injection, Inject the contents of 1 pen (150 mg total) under the skin every twenty-eight (28) days., Disp: 3 mL, Rfl: 1  No current facility-administered medications for this visit.      Objective   Vitals:    02/23/22 1504   BP: 124/89   Pulse: 88   Weight: 119.7 kg (264 lb)   Height: 188 cm (6' 2)           Physical Exam  General: Well appearing, no acute distress  Skin: No petechial rash, cystic acne on face.   Musculoskeletal:   No swelling or tenderness of the hands, wrists, elbows.  Normal range of motion of the shoulders.  Right knee is status post prior surgery, cool to touch without overt effusion and normal ROM. No left knee swelling or tenderness.  Normal ROM of the R hip, slightly decreased ROM in external rotation of the L hip (stable from prior)  L ankle slightly swollen with fairly limited ROM, R ankle cool without swelling and with full ROM  No swelling/bogginess or TTP of any MTPs  Neurologic: Alert and conversing normally.  5/5 strength throughout.    I personally spent 37 minutes face-to-face and non-face-to-face in the care of this patient, which includes all pre, intra, and post visit time on the date of service.  All documented time was specific to the E/M visit and does not include any procedures that may have been performed.

## 2022-02-24 NOTE — Unmapped (Signed)
PROCEDURE NOTE: After a time out and discussion of risks and benefits, including the option for continued conservative management, the patient gave verbal consent for a  left  ankle  aspiration. Risks discussed included infection (1:5000), pain, bleeding and damage to local structures. A time out was completed confirming patient identification, consent, and location of procedure. The  left  ankle  was identified and marked.  This area was prepped with betadine and subcutaneous lidocaine was used for local anesthesia.  3mL of cloudy synovial fluid was aspirated.  The patient tolerated the procedure well and there were no immediate complications. Patient advised on post-injection flare management and iatrogenic septic joint signs/symptoms requiring immediate medical attention.

## 2022-02-26 DIAGNOSIS — M1380 Other specified arthritis, unspecified site: Principal | ICD-10-CM

## 2022-02-26 MED ORDER — PREDNISONE 5 MG TABLET
ORAL_TABLET | ORAL | 0 refills | 23 days | Status: CP
Start: 2022-02-26 — End: 2022-03-21

## 2022-03-04 DIAGNOSIS — R051 Acute cough: Principal | ICD-10-CM

## 2022-03-04 MED ORDER — FAMOTIDINE 20 MG TABLET
ORAL_TABLET | Freq: Every day | ORAL | 1 refills | 90 days | Status: CP
Start: 2022-03-04 — End: 2023-03-04

## 2022-03-04 NOTE — Unmapped (Signed)
Patient is requesting the following refill  Requested Prescriptions     Pending Prescriptions Disp Refills    famotidine (PEPCID) 20 MG tablet [Pharmacy Med Name: FAMOTIDINE 20 MG TABLET] 90 tablet 1     Sig: Take 1 tablet (20 mg total) by mouth in the morning.       Recent Visits  Date Type Provider Dept   02/05/22 Office Visit Mangel, Benison Pap, DO [facility] [address] [facility]   01/25/22 Office Visit Mangel, Benison Pap, DO [place] [facility] [address] [facility]   09/17/21 Office Visit [name], FNP [place] [facility] [address] At [facility]   08/14/21 Office Visit [name], FNP [place] [facility] [address] [facility]   06/04/21 Office Visit Johnn Hai, Loleta Rose, FNP [place] [facility] [address] At [facility]   05/26/21 Office Visit Johnn Hai, Loleta Rose, FNP [place] [facility] [address] [facility]   03/20/21 Office Visit [name], FNP [place] [facility]   Showing recent visits within past 365 days with a meds authorizing provider and meeting all other requirements  Future Appointments  Date Type Provider Dept   03/22/22 Appointment [name], FNP  [facility] [address] [facility]   Showing future appointments within next 365 days with a meds authorizing provider and meeting all other requirements       Labs: Not applicable this refill

## 2022-03-15 NOTE — Unmapped (Signed)
[facility] Refill Coordination Note    James Singh, DOB: Feb 20, 1990  Phone: [phone] (home) [phone] (work)      All above HIPAA information was verified with patient.         03/12/2022     9:26 PM   Specialty Rx Medication Refill Questionnaire   Which Medications would you like refilled and shipped? Cosentyx   Please list all current allergies: dust mites, grasses, sulfa, pollen   Have you missed any doses in the last 30 days? No   Have you had any changes to your medication(s) since your last refill? No   How many days remaining of each medication do you have at home? 0   If receiving an injectable medication, next injection date is 03/17/2022   Have you experienced any side effects in the last 30 days? No   Please enter the full address (street address, city, state, zip code) where you would like your medication(s) to be delivered to. [address] [place], [place] [address]   Please specify on which day you would like your medication(s) to arrive. Note: if you need your medication(s) within 3 days, please call the pharmacy to schedule your order at [phone]  03/16/2022   Has your insurance changed since your last refill? No   Would you like a pharmacist to call you to discuss your medication(s)? No   Do you require a signature for your package? (Note: if we are billing Medicare Part B or your order contains a controlled substance, we will require a signature) No         Completed refill call assessment today to schedule patient's medication shipment from the [facility] [phone]).  All relevant notes have been reviewed.       Confirmed patient received a Conservation officer, historic buildings and a Surveyor, mining with first shipment. The patient will receive a drug information handout for each medication shipped and additional FDA Medication Guides as required.         REFERRAL TO PHARMACIST     Referral to the pharmacist: Not needed      [facility]     Shipping address confirmed in Epic.     Delivery Scheduled: Yes, Expected medication delivery date: 2/7.     Medication will be delivered via UPS to the prescription address in Epic WAM.    Julianne Rice, PharmD   [facility] Specialty Pharmacist

## 2022-03-16 MED FILL — COSENTYX PEN 150 MG/ML SUBCUTANEOUS: SUBCUTANEOUS | 28 days supply | Qty: 1 | Fill #2

## 2022-03-22 ENCOUNTER — Ambulatory Visit: Admit: 2022-03-22 | Discharge: 2022-03-23 | Payer: PRIVATE HEALTH INSURANCE | Attending: Family | Primary: Family

## 2022-03-22 DIAGNOSIS — M1380 Other specified arthritis, unspecified site: Principal | ICD-10-CM

## 2022-03-22 DIAGNOSIS — J4520 Mild intermittent asthma, uncomplicated: Principal | ICD-10-CM

## 2022-03-22 DIAGNOSIS — K219 Gastro-esophageal reflux disease without esophagitis: Principal | ICD-10-CM

## 2022-03-22 NOTE — Unmapped (Signed)
Assessment and Plan:      Diagnosis ICD-10-CM Associated Orders   1. Gastroesophageal reflux disease, unspecified whether esophagitis present  K21.9 Symptoms stable. Continue GERD precautions and famotidine prn.       2. Mild intermittent asthma without complication  J45.20 Well controlled. No current need for or use of inhalers.       3. Seronegative arthritis  M13.80 Defer ongoing management to rheumatology.           I personally spent 30 minutes face-to-face and non-face-to-face in the care of this patient, which includes all pre, intra, and post visit time on the date of service.    Return in about 6 months (around 09/20/2022) for Next scheduled follow up.    HPI:      James Singh is here for   Chief Complaint   Patient presents with    Asthma     Fasting.    Gastroesophageal Reflux     GERD: Patient presents for follow-up  dyspepsia. Symptoms have been present for approximately 3 years. Symptoms include heartburn and sore throat (all symptoms are intermittent) . The patient denies abdominal bloating, chest pain, choking on food, difficulty swallowing, hematemesis, regurgitation of undigested food, and shortness of breath. Symptoms appear to be worsened by large meals and medications:   .  Studies performed so far include upper GI, result: negative. Current treatment: lifestyle change including dietary changes, OTC H2 blocker: famotidine prn. Results of treatment: almost complete resolution of symptoms.     Asthma: Patient presents for  follow-up of asthma. Current symptoms None. Current medications: Rescue inhaler none, daily maintenance none. He is not seeing Pulmonology. Has no  action plan.  Patient reports in the Spring he often has congestion but not lung symptoms. Uses cetirizine and nasal spray in the Spring for seasonal allergies.       Seronegative arthritis: following with rheumatologist every 3 months. Rheumatologist changed medication from [place] to Cosentyx. He has been on Cosentyx for approximately 3 month and has not noticed much difference of joint pain. Initially on medication once weekly for 5 weeks. Current dosing once every 28 days and he begins to have joint pain when he is nearing next monthly dose.   Once a week for 5 weeks.    Past left ankle pain concerning for gout. A few months ago, fluid aspirated from the ankle joint that did not confirm gout.       Hx hyperlipidemia. Last lipid panel 09/17/21. TC (231), LDL (166), TG (121), HDL (41). Not currently on medication for this condition.      ROS:      Comprehensive 10 point ROS negative unless otherwise stated in the HPI.      [facility]MH Components:     Medication adherence and barriers to the treatment plan have been addressed. Opportunities to optimize healthy behaviors have been discussed. Patient / caregiver voiced understanding.    Past Medical/Surgical History:     Past Medical History:   Diagnosis Date    Arthritis     RA    Asthma     Obesity      Past Surgical History:   Procedure Laterality Date    JAW BONE [facility] HISTORICAL RESULT)      KNEE ARTHROPLASTY Right     NECK INJECTION      STERLID - L4 OR L5    PR UPPER GI ENDOSCOPY,BIOPSY N/A 11/09/2018    Procedure: UGI ENDOSCOPY; WITH BIOPSY, SINGLE OR MULTIPLE;  Surgeon: Bronson Curb, MD;  Location: [facility] PROCED[facility];  Service: Gastroenterology       Family History:     Family History   Problem Relation Age of Onset    Neuropathy Mother         damaged facial nerve    Hypertension Mother     No Known Problems Father     Mental illness Brother     Heart disease Maternal Aunt     Aortic aneurysm Maternal Aunt         abdominal    Heart disease Maternal Uncle     Aortic aneurysm Maternal Uncle         abdominal    Heart disease Maternal Grandmother     Hyperlipidemia Maternal Grandmother     Hypertension Maternal Grandmother     Stroke Maternal Grandmother     Aortic aneurysm Maternal Grandmother         abdominal    Heart disease Maternal Grandfather     Hyperlipidemia Maternal Grandfather     Hypertension Maternal Grandfather     Stroke Paternal Grandfather     Substance Abuse Disorder Neg Hx        Social History:     Social History     Tobacco Use    Smoking status: Never     Passive exposure: Never    Smokeless tobacco: Never   Vaping Use    Vaping status: Never Used   Substance Use Topics    Alcohol use: Yes     Alcohol/week: 4.0 standard drinks of alcohol     Types: 4 Shots of liquor per week     Comment: 1-2 drinks 2-4 times a month    Drug use: No       Allergies:     Sulfa (sulfonamide antibiotics), Sulfacetamide sodium, Sulfamethoxazole-trimethoprim, Bee pollen, Dog dander, Dog epithelium allergenic extract, Mite extract, and Pollens extract    Current Medications:     Current Outpatient Medications   Medication Sig Dispense Refill    empty container Misc USE AS DIRECTED 1 each 2    famotidine (PEPCID) 20 MG tablet Take 1 tablet (20 mg total) by mouth in the morning. 90 tablet 1    folic acid (FOLVITE) 1 MG tablet Take 1 tablet (1 mg total) by mouth daily. 30 tablet 11    methotrexate 2.5 MG tablet TAKE 6 TABLETS BY MOUTH ONE TIME PER WEEK 72 tablet 0    secukinumab (COSENTYX PEN) 150 mg/mL PnIj injection Inject the contents of 1 pen (150 mg total) under the skin every twenty-eight (28) days. 3 mL 1    colchicine (COLCRYS) 0.6 mg tablet TAKE 1 TABLET BY MOUTH EVERY DAY (Patient not taking: Reported on 03/22/2022) 90 tablet 1    EPINEPHrine (EPIPEN) 0.3 mg/0.3 mL injection  (Patient not taking: Reported on 03/22/2022)      minocycline (MINOCIN) 50 MG capsule       secukinumab (COSENTYX PEN) 150 mg/mL PnIj injection Inject 1 mL (150 mg total) under the skin once a week for 5 weeks. Loading dose (Patient not taking: Reported on 03/22/2022) 5 mL 0     No current facility-administered medications for this visit.       Health Maintenance:     Health Maintenance   Topic Date Due    DTaP/Tdap/Td Vaccines (10 - Td or Tdap) 03/21/2031    Pneumococcal Vaccine 0-64  Completed Hepatitis C Screen  Completed  COVID-19 Vaccine  Completed    Influenza Vaccine  Completed       Immunizations:     Immunization History   Administered Date(s) Administered    COVID-19 VAC,BIVALENT(3YR UP),PFIZER 11/14/2020    COVID-19 VACC,MRNA,(PFIZER)(PF) 04/15/2019, 05/08/2019, 10/08/2019    Covid-19 Vac, (64yr+) (Spikevax) Monovalent Xbb.1.5 Moder  12/09/2021    DTaP, Unspecified Formulation 09/28/1988, 11/29/1988, 02/18/1989, 02/23/1990, 09/01/1992    Hepatitis B Vaccine, Unspecified Formulation 07/22/1995, 08/31/1995, 06/08/1996    HiB, unspecified 02/18/1989, 07/14/1989, 10/28/1989    Influenza Vaccine Quad (IIV4 W/PRESERV) 10MO+ 11/09/2018    Influenza Vaccine Quad(IM)6 MO-Adult(PF) 01/28/2014, 11/21/2014, 03/08/2016, 01/03/2017, 12/05/2017, 10/21/2018, 12/09/2021    Influenza Virus Vaccine, unspecified formulation 12/09/2005, 11/22/2006, 10/21/2018, 11/06/2019, 11/14/2020    MMR 10/28/1989, 09/01/1992    Meningococcal ACWY, Unspecified Formulation 12/09/2005    PNEUMOCOCCAL POLYSACCHARIDE 23-VALENT 03/31/2015    Pneumococcal Conjugate 13-Valent 10/21/2014    Pneumococcal Conjugate 20-valent 03/22/2022    Pneumococcal, Unspecified Formulation 12/09/2005    Polio Virus Vaccine, Unspecified Formulation 09/28/1988, 11/29/1988, 02/23/1990, 09/01/1992    TD(TDVAX),ADSORBED,2LF(IM)(PF) 10/04/2002    TdaP 06/24/2010, 02/09/2011, 03/20/2021     I have reviewed and (if needed) updated the patient's problem list, medications, allergies, past medical and surgical history, social and family history.     Vital Signs:     Wt Readings from Last 3 Encounters:   03/22/22 (!) 120.2 kg (265 lb)   02/23/22 119.7 kg (264 lb)   02/05/22 (!) 122.1 kg (269 lb 1.6 oz)     Temp Readings from Last 3 Encounters:   03/22/22 37.1 ??C (98.7 ??F) (Oral)   02/05/22 36.2 ??C (97.1 ??F) (Temporal)   01/25/22 36.4 ??C (97.6 ??F) (Temporal)     BP Readings from Last 3 Encounters:   03/22/22 114/82   02/23/22 124/89   02/05/22 129/87     Pulse Readings from Last 3 Encounters:   03/22/22 88   02/23/22 88   02/05/22 68     Estimated body mass index is 34.02 kg/m?? as calculated from the following:    Height as of this encounter: 188 cm (6' 2).    Weight as of this encounter: 120.2 kg (265 lb).  Facility age limit for growth %iles is 20 years.      Objective:      General: Alert and oriented x3. Well-appearing. No acute distress.   HEENT:  Normocephalic.  Atraumatic. Conjunctiva and sclera normal. OP MMM without lesions.   Neck:  Supple. No thyroid enlargement. No adenopathy.   Heart:  Regular rate and rhythm. Normal S1, S2. No murmurs, rubs or gallops.   Lungs:  No respiratory distress.  Lungs clear to auscultation. No wheezes, rhonchi, or rales.   Skin:  Warm, dry. No rash or lesions present.   Neuro:  Non-focal. No obvious weakness.   Psych:  Affect normal, eye contact good, speech clear and coherent.     Noralyn Pick, FNP

## 2022-03-31 NOTE — Unmapped (Signed)
[facility] Pharm[facility] Refill Coordination Note    James Singh, DOB: 1990-12-04  Phone: [phone] (home) [phone] (work)      All above HIPAA information was verified with patient.         03/30/2022     7:26 PM   Specialty Rx Medication Refill Questionnaire   Which Medications would you like refilled and shipped? Costentyx   Please list all current allergies: dust mites, grasses, sulfa, dog dander   Have you missed any doses in the last 30 days? No   Have you had any changes to your medication(s) since your last refill? No   How many days remaining of each medication do you have at home? 0   If receiving an injectable medication, next injection date is 04/14/2022   Have you experienced any side effects in the last 30 days? No   Please enter the full address (street address, city, state, zip code) where you would like your medication(s) to be delivered to. [address] [place], [place] [address]   Please specify on which day you would like your medication(s) to arrive. Note: if you need your medication(s) within 3 days, please call the pharmacy to schedule your order at [phone]  04/09/2022   Has your insurance changed since your last refill? No   Would you like a pharmacist to call you to discuss your medication(s)? No   Do you require a signature for your package? (Note: if we are billing Medicare Part B or your order contains a controlled substance, we will require a signature) No         Completed refill call assessment today to schedule patient's medication shipment from the [facility] [phone]).  All relevant notes have been reviewed.       Confirmed patient received a Conservation officer, historic buildings and a Surveyor, mining with first shipment. The patient will receive a drug information handout for each medication shipped and additional FDA Medication Guides as required.         REFERRAL TO PHARMACIST     Referral to the pharmacist: Not needed      [facility]     Shipping address confirmed in Epic.     Delivery Scheduled: Yes, Expected medication delivery date: 04/09/22.     Medication will be delivered via UPS to the prescription address in Epic WAM.    Unk Lightning   [facility] Specialty Technician

## 2022-04-09 MED FILL — COSENTYX PEN 150 MG/ML SUBCUTANEOUS: SUBCUTANEOUS | 28 days supply | Qty: 1 | Fill #3

## 2022-04-13 DIAGNOSIS — M1380 Other specified arthritis, unspecified site: Principal | ICD-10-CM

## 2022-04-13 MED ORDER — METHOTREXATE SODIUM 2.5 MG TABLET
ORAL_TABLET | 0 refills | 0 days
Start: 2022-04-13 — End: ?

## 2022-04-14 MED ORDER — METHOTREXATE SODIUM 2.5 MG TABLET
ORAL_TABLET | 0 refills | 0 days | Status: CP
Start: 2022-04-14 — End: ?

## 2022-04-14 NOTE — Unmapped (Signed)
Methotrexate refill request  Last Visit Date: 02/23/2022  Next Visit Date: 05/26/2022    Lab Results   Component Value Date    ALT 24 12/09/2021    AST 27 12/09/2021    ALBUMIN 4.2 12/09/2021    CREATININE 0.84 12/09/2021     Lab Results   Component Value Date    WBC 4.7 12/09/2021    HGB 15.2 12/09/2021    HCT 42.6 12/09/2021    PLT 217 12/09/2021     Lab Results   Component Value Date    NEUTROPCT 66.6 12/09/2021    LYMPHOPCT 19.9 12/09/2021    MONOPCT 10.3 12/09/2021    EOSPCT 2.2 12/09/2021    BASOPCT 1.0 12/09/2021

## 2022-05-03 NOTE — Unmapped (Signed)
[facility] Pharm[facility] Refill Coordination Note    James Singh, DOB: 08/18/1990  Phone: [phone] (home) [phone] (work)      All above HIPAA information was verified with patient.         05/03/2022     9:41 AM   Specialty Rx Medication Refill Questionnaire   Which Medications would you like refilled and shipped? Cosentyx   Please list all current allergies: sulfa, pollen, mites, dog dander   Have you missed any doses in the last 30 days? No   Have you had any changes to your medication(s) since your last refill? No   How many days remaining of each medication do you have at home? 0   If receiving an injectable medication, next injection date is 05/12/2022   Have you experienced any side effects in the last 30 days? No   Please enter the full address (street address, city, state, zip code) where you would like your medication(s) to be delivered to. [address], [place], [place] [address]   Please specify on which day you would like your medication(s) to arrive. Note: if you need your medication(s) within 3 days, please call the pharmacy to schedule your order at [phone]  05/08/2022   Has your insurance changed since your last refill? No   Would you like a pharmacist to call you to discuss your medication(s)? No   Do you require a signature for your package? (Note: if we are billing Medicare Part B or your order contains a controlled substance, we will require a signature) No         Completed refill call assessment today to schedule patient's medication shipment from the [facility] [phone]).  All relevant notes have been reviewed.       Confirmed patient received a Conservation officer, historic buildings and a Surveyor, mining with first shipment. The patient will receive a drug information handout for each medication shipped and additional FDA Medication Guides as required.         REFERRAL TO PHARMACIST     Referral to the pharmacist: Not needed      [facility]     Shipping address confirmed in Epic.     Delivery Scheduled: Yes, Expected medication delivery date: 05/07/2022. Spoke with pt to confirm deliver date.     Medication will be delivered via UPS to the prescription address in Epic WAM.    Kerby Less   [facility] Specialty Technician

## 2022-05-06 MED FILL — COSENTYX PEN 150 MG/ML SUBCUTANEOUS: SUBCUTANEOUS | 28 days supply | Qty: 1 | Fill #4

## 2022-05-26 ENCOUNTER — Ambulatory Visit
Admit: 2022-05-26 | Discharge: 2022-05-27 | Payer: PRIVATE HEALTH INSURANCE | Attending: Student in an Organized Health Care Education/Training Program | Primary: Student in an Organized Health Care Education/Training Program

## 2022-05-26 DIAGNOSIS — M1380 Other specified arthritis, unspecified site: Principal | ICD-10-CM

## 2022-05-26 LAB — HEPATIC FUNCTION PANEL
ALBUMIN: 4.4 g/dL (ref 3.4–5.0)
ALKALINE PHOSPHATASE: 93 U/L (ref 46–116)
ALT (SGPT): 31 U/L (ref 10–49)
AST (SGOT): 29 U/L (ref <=34)
BILIRUBIN DIRECT: 0.4 mg/dL — ABNORMAL HIGH (ref 0.00–0.30)
BILIRUBIN TOTAL: 1.4 mg/dL — ABNORMAL HIGH (ref 0.3–1.2)
PROTEIN TOTAL: 8 g/dL (ref 5.7–8.2)

## 2022-05-26 LAB — CBC W/ AUTO DIFF
BASOPHILS ABSOLUTE COUNT: 0 10*9/L (ref 0.0–0.1)
BASOPHILS RELATIVE PERCENT: 1.2 %
EOSINOPHILS ABSOLUTE COUNT: 0.2 10*9/L (ref 0.0–0.5)
EOSINOPHILS RELATIVE PERCENT: 4.1 %
HEMATOCRIT: 42.4 % (ref 39.0–48.0)
HEMOGLOBIN: 14.9 g/dL (ref 12.9–16.5)
LYMPHOCYTES ABSOLUTE COUNT: 0.8 10*9/L — ABNORMAL LOW (ref 1.1–3.6)
LYMPHOCYTES RELATIVE PERCENT: 20.6 %
MEAN CORPUSCULAR HEMOGLOBIN CONC: 35.1 g/dL (ref 32.0–36.0)
MEAN CORPUSCULAR HEMOGLOBIN: 31.2 pg (ref 25.9–32.4)
MEAN CORPUSCULAR VOLUME: 88.8 fL (ref 77.6–95.7)
MEAN PLATELET VOLUME: 8 fL (ref 6.8–10.7)
MONOCYTES ABSOLUTE COUNT: 0.6 10*9/L (ref 0.3–0.8)
MONOCYTES RELATIVE PERCENT: 16.7 %
NEUTROPHILS ABSOLUTE COUNT: 2.2 10*9/L (ref 1.8–7.8)
NEUTROPHILS RELATIVE PERCENT: 57.4 %
PLATELET COUNT: 199 10*9/L (ref 150–450)
RED BLOOD CELL COUNT: 4.77 10*12/L (ref 4.26–5.60)
RED CELL DISTRIBUTION WIDTH: 14.5 % (ref 12.2–15.2)
WBC ADJUSTED: 3.9 10*9/L (ref 3.6–11.2)

## 2022-05-26 LAB — CREATININE
CREATININE: 1.01 mg/dL
EGFR CKD-EPI (2021) MALE: >90 mL/min/{1.73_m2} (ref >=60)

## 2022-05-26 NOTE — Unmapped (Signed)
Rheumatology Clinic Follow-up Note     Assessment/Plan:    James Singh is a 32 y.o.  male with a PMHx of seronegative peripheral inflammatory arthritis (hips, knees, jaw), disc disease of the cervical spine, and pontine cavernous malformation who presents today for follow-up.  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 He had been controlled on Humira, and then had a flare while on Actemra in July 2022 so switched to [place]. Due to inadequate control, [name] was switched to Cosentxy (first started 12/2021). He is currently on MTX 15mg  weekly and Cosentyx.    Today, doing well without evidence of disease activity, however has had a few flares. Trending in the right direction and is tolerating Cosentyx well. Discussed dose increase to 300mg , patient is amenable.    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    [name], MD   Assistant Professor of Medicine  Department of Medicine/Division of Rheumatology  [facility]    Primary Care Provider: [name], FNP    HPI:  James Singh is a 32 y.o.  male with a PMHx of seronegative peripheral inflammatory arthritis (hips, knees, jaw), disc disease of the cervical spine, and pontine cavernous malformation who presents today for follow-up.    Today  Things are going pretty well, has not noticed any significant flare ups, and his left ankle is feeling much better than it was at his most recent visit. Some joints still ache here and there but nothing debilitating, and his ankles are still the joints that bother him the most, but that depends on how active he is. Every once in a while his left hip has a slight flare of stiffness, with last one being about 3 weeks ago with 1-2 days. Doesn't interfere with his work. He does have a travel pair of crutches that he brings with him when he travels just in case.    Disease History  Mr. Donigan developed inflammatory arthritis of the R knee and left hip in late 2013.  He was placed on steroids, with some improvement.  However, he flared during tapers and was subsequently placed on methotrexate.  Due to persistent hip pain despite MTX, he was started on Humira in June 2014.   The patient has a history of terminal ileal ulcer in 2013 and has followed with GI in the past. Biopsy was consistent with acute inflammation, not IBD.   In regard to his seronegative arthritis, he is on Humira 40mg  SQ q2 week, MTX 20mg  SQ qweek, and daily folic. He flared in 2018 when trying to taper MTX.  MTX was decreased to 15mg  weekly in Sept 2020 due to LFT elevated thought to be related to NASH and Tylenol use. Humira was stopped in Nov 2021 when he developed cutaneous leukocytoclastic vasculitis.  Actemra was started in January 2022 as an alternative biologic therapy. He had a flare in July 2022 with R knee & L hip involvement and was  switched to [place].     Review of Systems:  Positive findings noted above, otherwise a 14 point review of systems was reviewed and negative    Past Medical, Surgical, Family and Social History reviewed and updated per EMR     Allergies:  Sulfa (sulfonamide antibiotics), Sulfacetamide sodium, Sulfamethoxazole-trimethoprim, Bee pollen, Dog dander, Dog epithelium allergenic extract, Mite extract, and Pollens extract    Medications:     Current Outpatient Medications:     empty container Misc, USE AS DIRECTED, Disp: 1 each, Rfl: 2    EPINEPHrine (EPIPEN) 0.3 mg/0.3 mL injection, , Disp: , Rfl: famotidine (PEPCID) 20 MG tablet, Take 1 tablet (20 mg total) by mouth in the morning., Disp: 90 tablet, Rfl: 1    folic acid (FOLVITE) 1 MG tablet, Take 1 tablet (1 mg total) by mouth daily., Disp: 30 tablet, Rfl: 11    methotrexate 2.5 MG tablet, TAKE 6 TABLETS BY MOUTH ONE TIME PER WEEK, Disp: 72 tablet, Rfl: 0    secukinumab (COSENTYX PEN) 150 mg/mL PnIj injection, Inject the contents of 1 pen (150 mg total) under the skin every twenty-eight (28) days., Disp: 3 mL, Rfl: 1      Objective   Vitals:    05/26/22 1525   BP: 131/88   Pulse: 82   Temp: 35.9 ??C (96.6 ??F)   TempSrc: Temporal   Weight: (!) 118.8 kg (262 lb)   Height: 188 cm (6' 2)       Physical Exam  General: Well appearing, no acute distress  Skin: No petechial rash, cystic acne on face.   Musculoskeletal:   No swelling or tenderness of the hands, wrists, elbows.  Normal range of motion of the shoulders.  Right knee is status post prior surgery, cool to touch without overt effusion and normal ROM. No left knee swelling or tenderness.  Normal ROM of the R hip, slightly decreased ROM in external rotation of the L hip (stable from prior)  L ankle nontender with normal range of motion, no effusion or erythema. R ankle nontender with normal range of motion  No swelling/bogginess or TTP of any MTPs  Neurologic: Alert and conversing normally.  5/5 strength throughout.    I personally spent 30 minutes face-to-face and non-face-to-face in the care of this patient, which includes all pre, intra, and post visit time on the date of service.  All documented time was specific to the E/M visit and does not include any procedures that may have been performed.

## 2022-05-27 MED ORDER — COSENTYX PEN 150 MG/ML SUBCUTANEOUS
SUBCUTANEOUS | 1 refills | 84 days | Status: CP
Start: 2022-05-27 — End: ?

## 2022-05-28 ENCOUNTER — Ambulatory Visit: Admit: 2022-05-28 | Discharge: 2022-05-29 | Payer: PRIVATE HEALTH INSURANCE | Attending: Family | Primary: Family

## 2022-05-28 DIAGNOSIS — J329 Chronic sinusitis, unspecified: Principal | ICD-10-CM

## 2022-05-28 DIAGNOSIS — J40 Bronchitis, not specified as acute or chronic: Principal | ICD-10-CM

## 2022-05-28 DIAGNOSIS — L4050 Arthropathic psoriasis, unspecified: Principal | ICD-10-CM

## 2022-05-28 MED ORDER — AMOXICILLIN 875 MG TABLET
ORAL_TABLET | Freq: Two times a day (BID) | ORAL | 0 refills | 10 days | Status: CP
Start: 2022-05-28 — End: 2022-06-07

## 2022-05-28 MED ORDER — COSENTYX PEN 150 MG/ML SUBCUTANEOUS
SUBCUTANEOUS | 1 refills | 84 days
Start: 2022-05-28 — End: ?

## 2022-05-28 MED ORDER — CETIRIZINE 10 MG TABLET
ORAL_TABLET | Freq: Every day | ORAL | 3 refills | 30 days | Status: CP
Start: 2022-05-28 — End: 2023-05-28

## 2022-05-28 NOTE — Unmapped (Signed)
Sent 4/18 to   [id number] - [place], [place] - [address]  [address], [place] [address]  Phone: [phone]

## 2022-06-01 DIAGNOSIS — L4050 Arthropathic psoriasis, unspecified: Principal | ICD-10-CM

## 2022-06-01 MED ORDER — COSENTYX PEN 150 MG/ML SUBCUTANEOUS
SUBCUTANEOUS | 1 refills | 84 days
Start: 2022-06-01 — End: ?

## 2022-06-01 NOTE — Unmapped (Signed)
Cosentyx refill ( different pharmacy)  Last Visit Date: 05/26/2022  Next Visit Date: 08/25/2022    Lab Results   Component Value Date    ALT 31 05/26/2022    AST 29 05/26/2022    ALBUMIN 4.4 05/26/2022    CREATININE 1.01 05/26/2022     Lab Results   Component Value Date    WBC 3.9 05/26/2022    HGB 14.9 05/26/2022    HCT 42.4 05/26/2022    PLT 199 05/26/2022     Lab Results   Component Value Date    NEUTROPCT 57.4 05/26/2022    LYMPHOPCT 20.6 05/26/2022    MONOPCT 16.7 05/26/2022    EOSPCT 4.1 05/26/2022    BASOPCT 1.2 05/26/2022

## 2022-06-02 MED ORDER — COSENTYX PEN 150 MG/ML SUBCUTANEOUS
SUBCUTANEOUS | 1 refills | 84 days | Status: CP
Start: 2022-06-02 — End: ?

## 2022-06-03 NOTE — Unmapped (Signed)
[facility] Refill Coordination Note    Specialty Medication(s) to be Shipped:   Inflammatory Disorders: Cosentyx    Other medication(s) to be shipped: No additional medications requested for fill at this time     James Singh, DOB: 06-12-1990  Phone: [phone] (home) [phone] (work)      All above HIPAA information was verified with patient.     Was a Nurse, learning disability used for this call? No    Completed refill call assessment today to schedule patient's medication shipment from the [facility] [phone]).  All relevant notes have been reviewed.     Specialty medication(s) and dose(s) confirmed: Regimen is correct and unchanged.   Changes to medications: Calden reports no changes at this time.  Changes to insurance: No  New side effects reported not previously addressed with a pharmacist or physician: None reported  Questions for the pharmacist: No    Confirmed patient received a Conservation officer, historic buildings and a Surveyor, mining with first shipment. The patient will receive a drug information handout for each medication shipped and additional FDA Medication Guides as required.       DISEASE/MEDICATION-SPECIFIC INFORMATION        For patients on injectable medications: Patient currently has 0 doses left.  Next injection is scheduled for 5/1.    SPECIALTY MEDICATION ADHERENCE     Medication Adherence    Patient reported X missed doses in the last month: 0  Specialty Medication: COSENTYX PEN 150 mg/mL Pnij injection (secukinumab)  Patient is on additional specialty medications: No              Were doses missed due to medication being on hold? No    COSENTYX PEN 150 mg/mL Pnij injection   : 0 days of medicine on hand       REFERRAL TO PHARMACIST     Referral to the pharmacist: Not needed      [facility]     Shipping address confirmed in Epic.       Delivery Scheduled: Yes, Expected medication delivery date: 4/30.     Medication will be delivered via UPS to the prescription address in Epic WAM.    Gaspar Cola [facility] Specialty Technician

## 2022-06-04 ENCOUNTER — Ambulatory Visit: Admit: 2022-06-04 | Discharge: 2022-06-05 | Payer: PRIVATE HEALTH INSURANCE | Attending: Family | Primary: Family

## 2022-06-04 DIAGNOSIS — R051 Acute cough: Principal | ICD-10-CM

## 2022-06-04 DIAGNOSIS — R0602 Shortness of breath: Principal | ICD-10-CM

## 2022-06-04 MED ORDER — BENZONATATE 200 MG CAPSULE
ORAL_CAPSULE | Freq: Three times a day (TID) | ORAL | 0 refills | 7 days | Status: CP | PRN
Start: 2022-06-04 — End: 2022-06-11

## 2022-06-04 MED ORDER — METHYLPREDNISOLONE 4 MG TABLETS IN A DOSE PACK
0 refills | 0 days | Status: CP
Start: 2022-06-04 — End: ?

## 2022-06-04 NOTE — Unmapped (Signed)
RESPIRATORY INFECTIONS:   It is important to be very cautious with use of antibiotics.  Inappropriate use of antibiotics increases the risk of harm to you and to other patients.       At least 90% of Sinus infections and Acute Bronchitis infections are due to viruses.  While the symptoms of viral infections can be just as miserable as bacterial infections, antibiotics do not work for viral infections.     Signs of POTENTIAL bacterial infection:   Persistent fever and thick green mucus that last for more than 10 days   Severe symptoms with facial pain, fever, thick green mucus for more than 3 days   Persistent cough with high fever   Respiratory illness in the setting of COPD or Emphysema     In the absence of these, is likely that your symptoms are due to a virus or allergies, thus antibiotics will not be effective.       Potential harms of antibiotics:   Antibiotic Resistance (colonization with bacteria that are not susceptible to commonly used antibiotics - this can lead to more serious infections for you and others in the future)   Unwanted Side Effects - nausea, diarrhea, heartburn, yeast infection, rash   C. Difficile infection (a serious antibiotic-associated diarrhea that can require hospitalization and be fatal)   Allergic Reaction - from rash to severe reactions including anaphylaxis, which can be fatal   Sudden Liver and Kidney failure - while rare, these can occur even with the most common antibiotics and are quite serious and potentially life-threatening     You should call back or make an appointment if you:   1) Suspect bacterial infection   2) Suspect influenza virus (for which we can treat if seen within 48 hours) - high fever (102 +), chills, body aches, cough, extreme fatigue, or   3) Have shortness of breath or wheezing   4) Have cough/cold symptoms that are not improving after 10-14 days (typical course for viral illness)     Symptom Treatment:     For drainage (nasal or throat), take an antihistamine such as:   - Claritin (Loratadine) 10 mg daily  OR   - Zyrtec (Cetirizine) 10 mg nightly  OR   - Allegra (Fexofenadine) 180 mg daily   *Do not take these if you have an enlarged prostate     For nasal stuffiness, try nasal steroid spray:   - Nasacort 1 spray each nostril twice daily as needed OR   - Flonase 1 spray each nostril twice daily as needed     For severe nasal stuffiness, you may try Affrin nasal spray twice daily for no longer than 3 days.     For congestion (head, chest, or sinus), take PLAIN Mucinex ER (Guaifenesin) 1200 mg twice daily.       For sinus congestion, try a sinus rinse (with sterile water).  Be sure to follow package instructions.     - If you do not have high blood pressure or heart rhythm issues, you may also try Sudafed for a few days.  It may cause palpitations and trouble sleeping.     For sore throat:   - gargle with warm salt water   - Vicks Chloraseptic spray   - throat lozenges     For cough suppression (if you don't have high blood pressure):   - 12 hour Delsym (dextromethoraphan)       Natural Remedies (Avoid if you are taking Coumadin / Warfarin):   -  Zinc lozenges 8-12 mg every 2-3 hours while awake (i.e. Elderberry Zinc lozenges by Ma Rings)   - Must be started within 24-48 hours of symptom onset   - Don't use Zinc nasal spray - can cause loss of smell   - Can cause mouth lesions, stomach upset, and copper deficiency   - Pelargonium Sidoides (i.e. Nature[employer] Way Umcka Coldcare in lozenge or as extract)   - Can reduce cold symptoms and duration in adults and kids greater than 2 years   - minimal side effects   - Echinacea purpurae (i.e. Nature's Way, Bioforce)   - Must be started in first 1-2 days of symptom onset   - Can reduce duration of cold symptoms in adults   - Must take high enough dose and get uncontaminated product   - *DO NOT take if you have asthma, allergies, HIV, or autoimmune disease   - Vitamin C   - May decrease duration of cold symptoms in adults and children   - Dose of at least 200 mg daily   - May prevent colds in athletes and those exposed to extreme cold temperatures   - Honey (Pure Honey, Buckwheat if possible)   - 1 teaspoon 30 minutes before bed helps decrease frequency of cough   - May be better than benadryl and dextromethorphan   - *SHOULD NEVER be given to children less than 83 months of age     *There are prescription medications that are not antibiotics that may help with some of your symptoms if over-the-counter or natural remedies are not helping enough

## 2022-06-04 NOTE — Unmapped (Signed)
Assessment and Plan:     Acute cough  - methylPREDNISolone (MEDROL DOSEPACK) 4 mg tablet; follow package directions  - benzonatate (TESSALON) 200 MG capsule; Take 1 capsule (200 mg total) by mouth Three (3) times a day as needed for cough for up to 7 days.  - Respiratory Pathogen Panel    SOB (shortness of breath)  - methylPREDNISolone (MEDROL DOSEPACK) 4 mg tablet; follow package directions  - Respiratory Pathogen Panel       Counseled patient on the viral etiology of his illness and possibly that is the reason the antibiotics have not alleviated his symptoms. Recommended treating patients dyspnea with an inhaler and/or a steroid, patient agreeable. Trial Medrol Dose pack. Recommended patient initiate benzonatate and Tussinex for night, patient agreeable. Trial benzonatate 200 mg TID prn x 7 days and Tussionex 5 ml BID prn. Ordered Respiratory Pathogen Panel. Will contact patient with the results. If symptoms worsen or persist contact this provider.    I personally spent 32 minutes face-to-face and non-face-to-face in the care of this patient, which includes all pre, intra, and post visit time on the date of service.    Return if symptoms worsen or fail to improve.    HPI:      James Singh is here for   Chief Complaint   Patient presents with    Cough     Barky cough x 2 weeks.  Sometimes productive.  Chest hurts to cough and breathe.  Afebrile.      Congestion     Head congestion.  Sinus pressure, runny nose.    Fatigue    Generalized Body Aches    SOB with exertion     Patient presents today for a intermittent productive cough, sore throat, congestion/rhinorrhea, sinus pressure, fever, fatigue, myalgias, and dyspnea with exertion. When he produces phlegm it is green in color. His symptoms presented 11 days ago. He has tried OTC Mucinex and Nyquil which provided no relief. Patient was seen last Friday in which he was placed on amoxicillin and Zyrtec which has not alleviated his symptoms. He has not had any known sick contacts.      ROS:      Comprehensive 10 point ROS negative unless otherwise stated in the HPI.      [facility] Components:     Medication adherence and barriers to the treatment plan have been addressed. Opportunities to optimize healthy behaviors have been discussed. Patient / caregiver voiced understanding.    Past Medical/Surgical History:     Past Medical History:   Diagnosis Date    Arthritis     RA    Asthma     Obesity      Past Surgical History:   Procedure Laterality Date    JAW BONE [facility] HISTORICAL RESULT)      KNEE ARTHROPLASTY Right     NECK INJECTION      STERLID - L4 OR L5    PR UPPER GI ENDOSCOPY,BIOPSY N/A 11/09/2018    Procedure: UGI ENDOSCOPY; WITH BIOPSY, SINGLE OR MULTIPLE;  Surgeon: Bronson Curb, MD;  Location: [facility] PROCED[facility];  Service: Gastroenterology       Family History:     Family History   Problem Relation Age of Onset    Neuropathy Mother         damaged facial nerve    Hypertension Mother     No Known Problems Father     Mental illness Brother  Heart disease Maternal Aunt     Aortic aneurysm Maternal Aunt         abdominal    Heart disease Maternal Uncle     Aortic aneurysm Maternal Uncle         abdominal    Heart disease Maternal Grandmother     Hyperlipidemia Maternal Grandmother     Hypertension Maternal Grandmother     Stroke Maternal Grandmother     Aortic aneurysm Maternal Grandmother         abdominal    Heart disease Maternal Grandfather     Hyperlipidemia Maternal Grandfather     Hypertension Maternal Grandfather     Stroke Paternal Grandfather     Substance Abuse Disorder Neg Hx        Social History:     Social History     Tobacco Use    Smoking status: Never     Passive exposure: Never    Smokeless tobacco: Never   Vaping Use    Vaping status: Never Used   Substance Use Topics    Alcohol use: Yes     Alcohol/week: 4.0 standard drinks of alcohol     Types: 4 Shots of liquor per week     Comment: 1-2 drinks 2-4 times a month    Drug use: No Allergies:     Sulfa (sulfonamide antibiotics), Sulfacetamide sodium, Sulfamethoxazole-trimethoprim, Bee pollen, Dog dander, Dog epithelium allergenic extract, Mite extract, and Pollens extract    Current Medications:     Current Outpatient Medications   Medication Sig Dispense Refill    acetaminophen (TYLENOL EXTRA STRENGTH) 500 MG tablet Take 1 tablet (500 mg total) by mouth every six (6) hours as needed for pain. 100 tablet     amoxicillin (AMOXIL) 875 MG tablet Take 1 tablet (875 mg total) by mouth every twelve (12) hours for 10 days. 20 tablet 0    cetirizine (ZYRTEC) 10 MG tablet Take 1 tablet (10 mg total) by mouth daily. 30 tablet 3    empty container Misc USE AS DIRECTED 1 each 2    folic acid (FOLVITE) 1 MG tablet Take 1 tablet (1 mg total) by mouth daily. 30 tablet 11    methotrexate 2.5 MG tablet TAKE 6 TABLETS BY MOUTH ONE TIME PER WEEK 72 tablet 0    secukinumab (COSENTYX PEN) 150 mg/mL PnIj injection Inject 2 mL (300 mg total) under the skin every twenty-eight (28) days. 6 mL 1    benzonatate (TESSALON) 200 MG capsule Take 1 capsule (200 mg total) by mouth Three (3) times a day as needed for cough for up to 7 days. 21 capsule 0    EPINEPHrine (EPIPEN) 0.3 mg/0.3 mL injection  (Patient not taking: Reported on 06/04/2022)      methylPREDNISolone (MEDROL DOSEPACK) 4 mg tablet follow package directions 1 each 0     No current facility-administered medications for this visit.       Health Maintenance:     Health Maintenance   Topic Date Due    DTaP/Tdap/Td Vaccines (10 - Td or Tdap) 03/21/2031    Pneumococcal Vaccine 0-64  Completed    Hepatitis C Screen  Completed    COVID-19 Vaccine  Completed    Influenza Vaccine  Completed       Immunizations:     Immunization History   Administered Date(s) Administered    COVID-19 VAC,BIVALENT(28YR UP),PFIZER 11/14/2020    COVID-19 VACC,MRNA,(PFIZER)(PF) 04/15/2019, 05/08/2019, 10/08/2019    Covid-19 Vac, (11yr+) (Spikevax) Monovalent  Xbb.1.5 Moder  12/09/2021 DTaP, Unspecified Formulation 09/28/1988, 11/29/1988, 02/18/1989, 02/23/1990, 09/01/1992    Hepatitis B Vaccine, Unspecified Formulation 07/22/1995, 08/31/1995, 06/08/1996    HiB, unspecified 02/18/1989, 07/14/1989, 10/28/1989    Influenza Vaccine Quad (IIV4 W/PRESERV) 29MO+ 11/09/2018    Influenza Vaccine Quad(IM)6 MO-Adult(PF) 01/28/2014, 11/21/2014, 03/08/2016, 01/03/2017, 12/05/2017, 10/21/2018, 12/09/2021    Influenza Virus Vaccine, unspecified formulation 12/09/2005, 11/22/2006, 10/21/2018, 11/06/2019, 11/14/2020    MMR 10/28/1989, 09/01/1992    Meningococcal ACWY, Unspecified Formulation 12/09/2005    PNEUMOCOCCAL POLYSACCHARIDE 23-VALENT 03/31/2015    Pneumococcal Conjugate 13-Valent 10/21/2014    Pneumococcal Conjugate 20-valent 03/22/2022    Pneumococcal, Unspecified Formulation 12/09/2005    Polio Virus Vaccine, Unspecified Formulation 09/28/1988, 11/29/1988, 02/23/1990, 09/01/1992    TD(TDVAX),ADSORBED,2LF(IM)(PF) 10/04/2002    TdaP 06/24/2010, 02/09/2011, 03/20/2021     I have reviewed and (if needed) updated the patient's problem list, medications, allergies, past medical and surgical history, social and family history.     Vital Signs:     Wt Readings from Last 3 Encounters:   06/04/22 (!) 122.9 kg (271 lb)   05/28/22 (!) 118.3 kg (260 lb 11.2 oz)   05/26/22 (!) 118.8 kg (262 lb)     Temp Readings from Last 3 Encounters:   06/04/22 36.4 ??C (97.5 ??F) (Oral)   05/28/22 36.4 ??C (97.6 ??F) (Temporal)   05/26/22 35.9 ??C (96.6 ??F) (Temporal)     BP Readings from Last 3 Encounters:   06/04/22 120/82   05/28/22 130/74   05/26/22 131/88     Pulse Readings from Last 3 Encounters:   06/04/22 84   05/28/22 97   05/26/22 82     Estimated body mass index is 34.79 kg/m?? as calculated from the following:    Height as of this encounter: 188 cm (6' 2).    Weight as of this encounter: 122.9 kg (271 lb).  Facility age limit for growth %iles is 20 years.      Objective:      General: Alert and oriented x3. Well-appearing. No acute distress. Frequent cough during exam.  HEENT:  Normocephalic.  Atraumatic. Conjunctiva and sclera normal. Audible nasal congestion.  Neck:  Supple.   Heart:  Regular rate and rhythm. Normal S1, S2. No murmurs, rubs or gallops.  Lungs:  No respiratory distress.  Lungs clear to auscultation. No wheezes, rhonchi, or rales.   Skin:  Warm, dry. No rash or lesions present on visible skin.  Neuro:  Non-focal. No obvious weakness.   Psych:  Affect normal, eye contact good, speech clear and coherent.          I attest that I, Arta Bruce, personally documented this note while acting as scribe for Noralyn Pick, FNP.      Arta Bruce, Scribe.  06/04/2022     The documentation recorded by the scribe accurately reflects the service I personally performed and the decisions made by me.     Noralyn Pick, FNP

## 2022-06-07 DIAGNOSIS — M1380 Other specified arthritis, unspecified site: Principal | ICD-10-CM

## 2022-06-07 MED ORDER — HYDROCODONE 10 MG-CHLORPHENIRAMINE 8 MG/5 ML ORAL SUSP EXTEND.REL 12HR
Freq: Two times a day (BID) | ORAL | 0 refills | 7 days | Status: CP | PRN
Start: 2022-06-07 — End: ?

## 2022-06-07 NOTE — Unmapped (Signed)
Specialty Medication(s): Cosentyx    Mr.Effertz has been dis-enrolled from the [facility] specialty pharmacy services due to a pharmacy change. The patient is now filling at [facility] .    Additional information provided to the patient: N/A    Sherral Hammers, PharmD  [facility] Specialty Pharmacist

## 2022-06-07 NOTE — Unmapped (Signed)
The [facility] has received the prescription(s) for COSENTYX PEN (2 PENS) 150 mg/mL Pnij injection (secukinumab). The triage team has completed the benefits investigation and has determined that the patient is NOT able to fill this medication at the [facility] due to insurance plan limitations. Please see additional information below and re-route the prescription to the preferred pharmacy. Thank you.    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

## 2022-06-07 NOTE — Unmapped (Signed)
James Singh 's COSENTYX PEN (2 PENS) 150 mg/mL Pnij injection (secukinumab) shipment will be canceled  as a result of no longer being eligible to fill at [facility] pharmacy. (Pt has new ins. Plan)    I have reached out to the patient  at [phone] and communicated the delay. We will not reschedule the medication and have removed this/these medication(s) from the work request.  We have canceled this work request.

## 2022-06-08 MED ORDER — COSENTYX PEN 150 MG/ML SUBCUTANEOUS
SUBCUTANEOUS | 1 refills | 84 days | Status: CP
Start: 2022-06-08 — End: ?

## 2022-07-21 DIAGNOSIS — M1380 Other specified arthritis, unspecified site: Principal | ICD-10-CM

## 2022-07-21 MED ORDER — PREDNISONE 10 MG TABLET
ORAL_TABLET | ORAL | 0 refills | 23 days | Status: CP
Start: 2022-07-21 — End: 2022-08-13

## 2022-07-21 MED ORDER — METHOTREXATE SODIUM 2.5 MG TABLET
ORAL_TABLET | 0 refills | 0 days
Start: 2022-07-21 — End: ?

## 2022-07-22 MED ORDER — METHOTREXATE SODIUM 2.5 MG TABLET
ORAL_TABLET | ORAL | 1 refills | 84 days | Status: CP
Start: 2022-07-22 — End: ?

## 2022-08-21 MED ORDER — PREDNISONE 10 MG TABLET
ORAL_TABLET | ORAL | 0 refills | 0 days
Start: 2022-08-21 — End: ?

## 2022-08-23 MED ORDER — PREDNISONE 10 MG TABLET
ORAL_TABLET | ORAL | 0 refills | 23 days
Start: 2022-08-23 — End: 2022-09-14

## 2022-08-25 ENCOUNTER — Ambulatory Visit: Admit: 2022-08-25 | Discharge: 2022-08-26 | Payer: PRIVATE HEALTH INSURANCE | Attending: Family | Primary: Family

## 2022-08-25 DIAGNOSIS — Z5181 Encounter for therapeutic drug level monitoring: Principal | ICD-10-CM

## 2022-08-25 DIAGNOSIS — M1380 Other specified arthritis, unspecified site: Principal | ICD-10-CM

## 2022-08-25 DIAGNOSIS — Z79631 Encounter for methotrexate monitoring: Principal | ICD-10-CM

## 2022-08-25 DIAGNOSIS — L4050 Arthropathic psoriasis, unspecified: Principal | ICD-10-CM

## 2022-08-25 MED ORDER — METHOTREXATE SODIUM 2.5 MG TABLET
ORAL_TABLET | ORAL | 1 refills | 84 days | Status: CP
Start: 2022-08-25 — End: ?

## 2022-08-25 MED ORDER — FOLIC ACID 1 MG TABLET
ORAL_TABLET | Freq: Every day | ORAL | 3 refills | 90 days | Status: CP
Start: 2022-08-25 — End: 2023-08-25

## 2022-11-03 ENCOUNTER — Ambulatory Visit: Admit: 2022-11-03 | Discharge: 2022-11-04 | Payer: PRIVATE HEALTH INSURANCE | Attending: Family | Primary: Family

## 2022-11-03 DIAGNOSIS — Z23 Encounter for immunization: Principal | ICD-10-CM

## 2022-11-03 DIAGNOSIS — H6503 Acute serous otitis media, bilateral: Principal | ICD-10-CM

## 2022-11-03 DIAGNOSIS — H60393 Other infective otitis externa, bilateral: Principal | ICD-10-CM

## 2022-11-03 MED ORDER — NEOMYCIN-POLYMYXIN-HYDROCORT 3.5 MG-10,000 UNIT/ML-1 % EAR DROPS,SUSP
Freq: Four times a day (QID) | OTIC | 1 refills | 17 days | Status: CP
Start: 2022-11-03 — End: 2022-11-13

## 2022-11-03 MED ORDER — AZITHROMYCIN 500 MG TABLET
ORAL_TABLET | Freq: Every day | ORAL | 0 refills | 3 days | Status: CP
Start: 2022-11-03 — End: 2022-11-06

## 2022-11-22 DIAGNOSIS — L4050 Arthropathic psoriasis, unspecified: Principal | ICD-10-CM

## 2022-11-22 MED ORDER — COSENTYX PEN 300 MG/2 PENS (150 MG/ML) SUBCUTANEOUS
0 refills | 0 days | Status: CP
Start: 2022-11-22 — End: ?

## 2022-11-25 ENCOUNTER — Ambulatory Visit
Admit: 2022-11-25 | Discharge: 2022-11-26 | Payer: PRIVATE HEALTH INSURANCE | Attending: Student in an Organized Health Care Education/Training Program | Primary: Student in an Organized Health Care Education/Training Program

## 2022-11-25 DIAGNOSIS — Z5181 Encounter for therapeutic drug level monitoring: Principal | ICD-10-CM

## 2022-11-25 DIAGNOSIS — M1380 Other specified arthritis, unspecified site: Principal | ICD-10-CM

## 2022-11-25 DIAGNOSIS — Z79631 Encounter for methotrexate monitoring: Principal | ICD-10-CM

## 2022-11-25 MED ORDER — FOLIC ACID 1 MG TABLET
ORAL_TABLET | Freq: Every day | ORAL | 3 refills | 90 days | Status: CP
Start: 2022-11-25 — End: 2023-11-25

## 2022-12-20 ENCOUNTER — Ambulatory Visit: Admit: 2022-12-20 | Discharge: 2022-12-21 | Payer: PRIVATE HEALTH INSURANCE

## 2022-12-20 ENCOUNTER — Ambulatory Visit: Admit: 2022-12-20 | Discharge: 2022-12-21 | Payer: PRIVATE HEALTH INSURANCE | Attending: Family | Primary: Family

## 2022-12-20 DIAGNOSIS — W11XXXA Fall on and from ladder, initial encounter: Principal | ICD-10-CM

## 2022-12-20 DIAGNOSIS — K760 Fatty (change of) liver, not elsewhere classified: Principal | ICD-10-CM

## 2022-12-20 DIAGNOSIS — S3992XA Unspecified injury of lower back, initial encounter: Principal | ICD-10-CM

## 2022-12-20 DIAGNOSIS — M1380 Other specified arthritis, unspecified site: Principal | ICD-10-CM

## 2022-12-20 MED ORDER — PREDNISONE 10 MG TABLET
ORAL_TABLET | 0 refills | 0 days | Status: CP
Start: 2022-12-20 — End: ?

## 2022-12-21 DIAGNOSIS — L4050 Arthropathic psoriasis, unspecified: Principal | ICD-10-CM

## 2022-12-21 MED ORDER — COSENTYX PEN 300 MG/2 PENS (150 MG/ML) SUBCUTANEOUS
3 refills | 0 days | Status: CP
Start: 2022-12-21 — End: ?

## 2023-01-24 MED ORDER — PREDNISONE 10 MG TABLET
ORAL_TABLET | ORAL | 0 refills | 21.00 days | Status: CP
Start: 2023-01-24 — End: 2023-02-14

## 2023-01-25 ENCOUNTER — Emergency Department
Admission: EM | Admit: 2023-01-25 | Discharge: 2023-01-25 | Discharge status: Against Medical Advice | Payer: Managed Care, Other (non HMO) | Attending: Emergency Medicine | Admitting: Emergency Medicine

## 2023-01-25 ENCOUNTER — Other Ambulatory Visit: Payer: Self-pay

## 2023-01-25 DIAGNOSIS — R197 Diarrhea, unspecified: Secondary | ICD-10-CM | POA: Diagnosis not present

## 2023-01-25 DIAGNOSIS — R519 Headache, unspecified: Secondary | ICD-10-CM | POA: Insufficient documentation

## 2023-01-25 DIAGNOSIS — Z1152 Encounter for screening for COVID-19: Secondary | ICD-10-CM | POA: Diagnosis not present

## 2023-01-25 DIAGNOSIS — R112 Nausea with vomiting, unspecified: Secondary | ICD-10-CM | POA: Diagnosis not present

## 2023-01-25 DIAGNOSIS — Z5321 Procedure and treatment not carried out due to patient leaving prior to being seen by health care provider: Secondary | ICD-10-CM | POA: Insufficient documentation

## 2023-01-25 DIAGNOSIS — R109 Unspecified abdominal pain: Secondary | ICD-10-CM | POA: Diagnosis present

## 2023-01-25 LAB — CBC
HCT: 45 % (ref 39.0–52.0)
Hemoglobin: 16.2 g/dL (ref 13.0–17.0)
MCH: 31.5 pg (ref 26.0–34.0)
MCHC: 36 g/dL (ref 30.0–36.0)
MCV: 87.5 fL (ref 80.0–100.0)
Platelets: 193 10*3/uL (ref 150–400)
RBC: 5.14 MIL/uL (ref 4.22–5.81)
RDW: 13.9 % (ref 11.5–15.5)
WBC: 6.7 10*3/uL (ref 4.0–10.5)
nRBC: 0 % (ref 0.0–0.2)

## 2023-01-25 LAB — URINALYSIS, ROUTINE W REFLEX MICROSCOPIC
Bilirubin Urine: NEGATIVE
Glucose, UA: NEGATIVE mg/dL
Hgb urine dipstick: NEGATIVE
Ketones, ur: NEGATIVE mg/dL
Leukocytes,Ua: NEGATIVE
Nitrite: NEGATIVE
Protein, ur: 30 mg/dL — AB
Specific Gravity, Urine: 1.032 — ABNORMAL HIGH (ref 1.005–1.030)
pH: 5 (ref 5.0–8.0)

## 2023-01-25 LAB — COMPREHENSIVE METABOLIC PANEL
ALT: 63 U/L — ABNORMAL HIGH (ref 0–44)
AST: 36 U/L (ref 15–41)
Albumin: 4.6 g/dL (ref 3.5–5.0)
Alkaline Phosphatase: 80 U/L (ref 38–126)
Anion gap: 13 (ref 5–15)
BUN: 20 mg/dL (ref 6–20)
CO2: 25 mmol/L (ref 22–32)
Calcium: 9.5 mg/dL (ref 8.9–10.3)
Chloride: 100 mmol/L (ref 98–111)
Creatinine, Ser: 1.11 mg/dL (ref 0.61–1.24)
GFR, Estimated: >60 mL/min (ref >60)
Glucose, Bld: 161 mg/dL — ABNORMAL HIGH (ref 70–99)
Potassium: 3.9 mmol/L (ref 3.5–5.1)
Sodium: 138 mmol/L (ref 135–145)
Total Bilirubin: 1.7 mg/dL — ABNORMAL HIGH (ref <1.2)
Total Protein: 8.4 g/dL — ABNORMAL HIGH (ref 6.5–8.1)

## 2023-01-25 LAB — RESP PANEL BY RT-PCR (RSV, FLU A&B, COVID)  RVPGX2
Influenza A by PCR: NEGATIVE
Influenza B by PCR: NEGATIVE
Resp Syncytial Virus by PCR: NEGATIVE
SARS Coronavirus 2 by RT PCR: NEGATIVE

## 2023-01-25 LAB — LIPASE, BLOOD: Lipase: 27 U/L (ref 11–51)

## 2023-01-25 MED ORDER — ONDANSETRON 4 MG PO TBDP
4.0000 mg | ORAL_TABLET | Freq: Once | ORAL | Status: AC
  Administered 2023-01-25: 4 mg via ORAL
  Filled 2023-01-25: qty 1

## 2023-01-25 NOTE — ED Triage Notes (Signed)
Pt to ED via EMS from home, pt reports abd pain headache n/v/d that began earlier tonight. Pt denies urinary symptoms.

## 2023-01-27 ENCOUNTER — Ambulatory Visit
Admit: 2023-01-27 | Discharge: 2023-01-28 | Payer: PRIVATE HEALTH INSURANCE | Attending: Hematology & Oncology | Primary: Hematology & Oncology

## 2023-01-27 DIAGNOSIS — A059 Bacterial foodborne intoxication, unspecified: Principal | ICD-10-CM

## 2023-02-17 ENCOUNTER — Inpatient Hospital Stay: Admit: 2023-02-17 | Discharge: 2023-02-18 | Payer: PRIVATE HEALTH INSURANCE

## 2023-02-17 ENCOUNTER — Ambulatory Visit
Admit: 2023-02-17 | Discharge: 2023-02-18 | Payer: PRIVATE HEALTH INSURANCE | Attending: Student in an Organized Health Care Education/Training Program | Primary: Student in an Organized Health Care Education/Training Program

## 2023-02-17 ENCOUNTER — Ambulatory Visit: Admit: 2023-02-17 | Discharge: 2023-02-18 | Payer: PRIVATE HEALTH INSURANCE

## 2023-02-17 DIAGNOSIS — M1380 Other specified arthritis, unspecified site: Principal | ICD-10-CM

## 2023-02-17 MED ORDER — PREDNISONE 10 MG TABLET
ORAL_TABLET | ORAL | 0 refills | 15.00 days | Status: CP
Start: 2023-02-17 — End: 2023-03-04

## 2023-04-11 DIAGNOSIS — L4050 Arthropathic psoriasis, unspecified: Principal | ICD-10-CM

## 2023-04-11 MED ORDER — COSENTYX PEN 300 MG/2 PENS (150 MG/ML) SUBCUTANEOUS
3 refills | 0.00 days | Status: CP
Start: 2023-04-11 — End: ?

## 2023-04-13 DIAGNOSIS — M25552 Pain in left hip: Principal | ICD-10-CM

## 2023-04-13 DIAGNOSIS — L4050 Arthropathic psoriasis, unspecified: Principal | ICD-10-CM

## 2023-04-15 ENCOUNTER — Ambulatory Visit
Admit: 2023-04-15 | Discharge: 2023-04-16 | Payer: PRIVATE HEALTH INSURANCE | Attending: Student in an Organized Health Care Education/Training Program | Primary: Student in an Organized Health Care Education/Training Program

## 2023-04-15 DIAGNOSIS — M1380 Other specified arthritis, unspecified site: Principal | ICD-10-CM

## 2023-04-15 MED ORDER — TRAMADOL 50 MG TABLET
ORAL_TABLET | Freq: Two times a day (BID) | ORAL | 0 refills | 15.00 days | Status: CP | PRN
Start: 2023-04-15 — End: 2024-04-14

## 2023-04-21 ENCOUNTER — Ambulatory Visit
Admit: 2023-04-21 | Discharge: 2023-04-22 | Payer: PRIVATE HEALTH INSURANCE | Attending: Student in an Organized Health Care Education/Training Program | Primary: Student in an Organized Health Care Education/Training Program

## 2023-04-21 DIAGNOSIS — L709 Acne, unspecified: Principal | ICD-10-CM

## 2023-04-21 MED ORDER — CLINDAMYCIN 1 % LOTION
Freq: Every day | TOPICAL | 5 refills | 0.00 days | Status: CP
Start: 2023-04-21 — End: 2024-04-20

## 2023-04-21 MED ORDER — TRETINOIN 0.025 % TOPICAL CREAM
Freq: Every evening | TOPICAL | 4 refills | 60.00 days | Status: CP
Start: 2023-04-21 — End: 2024-04-20

## 2023-05-08 ENCOUNTER — Emergency Department
Admit: 2023-05-08 | Discharge: 2023-05-08 | Disposition: A | Discharge status: Home / Self Care | Payer: BLUE CROSS/BLUE SHIELD

## 2023-05-08 ENCOUNTER — Emergency Department: Admit: 2023-05-08 | Discharge: 2023-05-08 | Disposition: A | Discharge status: Home / Self Care

## 2023-05-08 MED ORDER — PREDNISONE 20 MG TABLET
ORAL_TABLET | 0 refills | 0 days | Status: CP
Start: 2023-05-08 — End: ?

## 2023-05-08 MED ORDER — OXYCODONE 5 MG TABLET
ORAL_TABLET | ORAL | 0 refills | 2 days | Status: CP | PRN
Start: 2023-05-08 — End: 2023-05-13

## 2023-05-18 ENCOUNTER — Inpatient Hospital Stay: Admit: 2023-05-18 | Discharge: 2023-05-19

## 2023-06-01 ENCOUNTER — Ambulatory Visit: Admit: 2023-06-01 | Discharge: 2023-06-02 | Payer: BLUE CROSS/BLUE SHIELD | Attending: Family | Primary: Family

## 2023-06-01 DIAGNOSIS — Z79631 Encounter for methotrexate monitoring: Principal | ICD-10-CM

## 2023-06-01 DIAGNOSIS — M25552 Pain in left hip: Principal | ICD-10-CM

## 2023-06-01 DIAGNOSIS — M1380 Other specified arthritis, unspecified site: Principal | ICD-10-CM

## 2023-06-01 DIAGNOSIS — L4050 Arthropathic psoriasis, unspecified: Principal | ICD-10-CM

## 2023-06-01 DIAGNOSIS — Z5181 Encounter for therapeutic drug level monitoring: Principal | ICD-10-CM

## 2023-06-01 MED ORDER — COSENTYX PEN 300 MG/2 PENS (150 MG/ML) SUBCUTANEOUS PEN INJECTOR
SUBCUTANEOUS | 3 refills | 84.00 days | Status: CP
Start: 2023-06-01 — End: 2024-05-31

## 2023-06-02 DIAGNOSIS — L4050 Arthropathic psoriasis, unspecified: Principal | ICD-10-CM

## 2023-06-02 MED ORDER — COSENTYX PEN 300 MG/2 PENS (150 MG/ML) SUBCUTANEOUS PEN INJECTOR
SUBCUTANEOUS | 3 refills | 84.00 days | Status: CP
Start: 2023-06-02 — End: 2024-06-01

## 2023-06-06 DIAGNOSIS — Z5181 Encounter for therapeutic drug level monitoring: Principal | ICD-10-CM

## 2023-06-06 DIAGNOSIS — Z79631 Encounter for methotrexate monitoring: Principal | ICD-10-CM

## 2023-06-15 DIAGNOSIS — L4050 Arthropathic psoriasis, unspecified: Principal | ICD-10-CM

## 2023-06-15 MED ORDER — COSENTYX PEN 300 MG/2 PENS (150 MG/ML) SUBCUTANEOUS PEN INJECTOR
SUBCUTANEOUS | 3 refills | 84.00000 days
Start: 2023-06-15 — End: 2024-06-14

## 2023-06-18 DIAGNOSIS — M1380 Other specified arthritis, unspecified site: Principal | ICD-10-CM

## 2023-06-18 MED ORDER — METHOTREXATE SODIUM 2.5 MG TABLET
ORAL_TABLET | 1 refills | 0.00000 days
Start: 2023-06-18 — End: ?

## 2023-06-20 ENCOUNTER — Ambulatory Visit: Admit: 2023-06-20 | Discharge: 2023-06-21 | Payer: BLUE CROSS/BLUE SHIELD

## 2023-06-20 DIAGNOSIS — M167 Other unilateral secondary osteoarthritis of hip: Principal | ICD-10-CM

## 2023-06-20 DIAGNOSIS — M25552 Pain in left hip: Principal | ICD-10-CM

## 2023-06-20 MED ORDER — METHOTREXATE SODIUM 2.5 MG TABLET
ORAL_TABLET | ORAL | 1 refills | 0.00000 days | Status: CP
Start: 2023-06-20 — End: ?

## 2023-07-13 MED ORDER — COSENTYX PEN 300 MG/2 PENS (150 MG/ML) SUBCUTANEOUS PEN INJECTOR
SUBCUTANEOUS | 3 refills | 84.00000 days | Status: CP
Start: 2023-07-13 — End: 2024-07-12

## 2023-08-04 NOTE — Unmapped (Signed)
 [facility]    Patient Onboarding/Medication Counseling    -Patient has new insurance now    James Singh is a 33 y.o. male with PsA who I am counseling today on continuation of therapy.  I am speaking to the patient.    Was a Nurse, learning disability used for this call? No    Verified patient's date of birth / HIPAA.    Specialty medication(s) to be sent: Inflammatory Disorders: Cosentyx       Non-specialty medications/supplies to be sent: n/a      Medications not needed at this time: n/a         Cosentyx  (secukinumab )    The patient declined counseling on medication administration, missed dose instructions, goals of therapy, side effects and monitoring parameters, warnings and precautions, drug/food interactions, and storage, handling precautions, and disposal because they have taken the medication previously. The information in the declined sections below are for informational purposes only and was not discussed with patient.     Medication & Administration     Dosage: Psoriatic arthritis: Inject the contents of 2 pens (300 mg total) under the skin every twenty-eight (28) days.      Lab tests required prior to treatment initiation:  Tuberculosis: Tuberculosis screening resulted in a non-reactive Quantiferon TB Gold assay.      Administration:     Artist all supplies needed for injection on a clean, flat working surface: medication syringe(s) removed from packaging, alcohol swab, sharps container, etc.  Look at the medication label - look for correct medication, correct dose, and check the expiration date  Look at the medication - the liquid in the syringe should appear clear and colorless to slightly yellow  Lay the syringe on a flat surface and allow it to warm up to room temperature for at least 15-30 minutes  Select injection site - you can use the front of your thigh or your belly (but not the area 2 inches around your belly button); if someone else is giving you the injection you can also use your upper arm in the skin covering your triceps muscle  Prepare injection site - wash your hands and clean the skin at the injection site with an alcohol swab and let it air dry, do not touch the injection site again before the injection  Pull off the needle safety cap, do not remove until immediately prior to injection  Pinch the skin - with your hand not holding the syringe pinch up a fold of skin at the injection site using your forefinger and thumb  Insert the needle into the fold of skin at about a 45 degree angle - it's best to use a quick dart-like motion  Push the plunger down slowly as far as it will go until the syringe is empty, if the plunger is not fully depressed the needle shield will not extend to cover the needle when it is removed, hold the syringe in place for a full 5 seconds  Check that the syringe is empty and keep pressing down on the plunger while you pull the needle out at the same angle as inserted; after the needle is removed completely from the skin, release the plunger allowing the needle shield to activate and cover the used needle  Dispose of the used syringe immediately in your sharps disposal container, do not attempt to recap the needle prior to disposing  If you see any blood at the injection site, press a cotton ball or  gauze on the site and maintain pressure until the bleeding stops, do not rub the injection site      Prefilled Sensoready?? auto-injector pen  Gather all supplies needed for injection on a clean, flat working surface: medication pen removed from packaging, alcohol swab, sharps container, etc.  Look at the medication label - look for correct medication, correct dose, and check the expiration date  Look at the medication - the liquid visible in the window on the side of the pen device should appear clear and colorless to slightly yellow  Lay the auto-injector pen on a flat surface and allow it to warm up to room temperature for at least 15-30 minutes  Select injection site - you can use the front of your thigh or your belly (but not the area 2 inches around your belly button); if someone else is giving you the injection you can also use your upper arm in the skin covering your triceps muscle  Prepare injection site - wash your hands and clean the skin at the injection site with an alcohol swab and let it air dry, do not touch the injection site again before the injection  Twist off the purple safety cap in the direction of the arrow, do not remove until immediately prior to injection and do not touch the yellow needle cover  Put the white needle cover against your skin at the injection site at a 90 degree angle, hold the pen such that you can see the clear medication window  Press down and hold the pen firmly against your skin, there will be a click when the injection starts  Continue to hold the pen firmly against your skin for about 10-15 seconds - the window will start to turn solid green  There will be a second click sound when the injection is almost complete, verify the window is solid green to indicate the injection is complete and then pull the pen away from your skin  Dispose of the used auto-injector pen immediately in your sharps disposal container the needle will be covered automatically  If you see any blood at the injection site, press a cotton ball or gauze on the site and maintain pressure until the bleeding stops, do not rub the injection site    Prefilled Unoready?? auto-injector pen  Gather all supplies needed for injection on a clean, flat working surface: medication pen removed from packaging, alcohol swab, sharps container, etc.  Look at the medication label - look for correct medication, correct dose, and check the expiration date  Look at the medication - the liquid visible in the window on the side of the pen device should appear clear and colorless to slightly yellow  Lay the auto-injector pen on a flat surface and allow it to warm up to room temperature for at least  30-45 minutes  Select injection site - you can use the front of your thigh or your belly (but not the area 2 inches around your belly button); if someone else is giving you the injection you can also use your upper arm in the skin covering your triceps muscle  Prepare injection site - wash your hands and clean the skin at the injection site with an alcohol swab and let it air dry, do not touch the injection site again before the injection  Pull off the purple safety cap in the direction of the arrow, do not remove until immediately prior to injection and do not touch the red needle cover  Put the red needle cover against your skin at the injection site at a 90 degree angle, hold the pen such that you can see the clear medication window  Press down and hold the pen firmly against your skin, there will be a click when the injection starts  Continue to hold the pen firmly against your skin for about 10-15 seconds - the window will start to turn solid green  There will be a second click sound when the injection is almost complete, verify the window is solid green to indicate the injection is complete and then pull the pen away from your skin  Dispose of the used auto-injector pen immediately in your sharps disposal container the needle will be covered automatically  If you see any blood at the injection site, press a cotton ball or gauze on the site and maintain pressure until the bleeding stops, do not rub the injection site      Adherence/Missed dose instructions:  If your injection is given more than 4 days after your scheduled injection date - consult your pharmacist for additional instructions on how to adjust your dosing schedule.        Goals of Therapy     Ankylosing spondylitis  Relief of symptoms  Maintenance of function  Prevention of complications of spinal disease  Minimization of extraspinal and extraarticular manifestations and comorbidities  Maintenance of effective psychosocial functioning    Plaque Psoriasis  Minimize areas of skin involvement (% BSA)  Avoidance of long term glucocorticoid use  Maintenance of effective psychosocial functioning    Hidradenitis Suppurativa  Reduce the frequency and severity of new lesions  Minimize pain and suppuration  Prevent disease progression and limit scarring  Maintenance of effective psychosocial functioning    Psoriatic arthritis  Achieve remission/inactive disease or low/minimal disease activity  Maintenance of function  Minimization of systemic manifestations and comorbidities  Maintenance of effective psychosocial functioning      Side Effects & Monitoring Parameters     Injection site reaction (redness, irritation, inflammation localized to the site of administration)  Signs of a common cold - minor sore throat, runny or stuffy nose, etc.  Diarrhea    The following side effects should be reported to the provider:  Signs of a hypersensitivity reaction - rash; hives; itching; red, swollen, blistered, or peeling skin; wheezing; tightness in the chest or throat; difficulty breathing, swallowing, or talking; swelling of the mouth, face, lips, tongue, or throat; etc.  Reduced immune function - report signs of infection such as fever; chills; body aches; very bad sore throat; ear or sinus pain; cough; more sputum or change in color of sputum; pain with passing urine; wound that will not heal, etc.  Also at a slightly higher risk of some malignancies (mainly skin and blood cancers) due to this reduced immune function.  In the case of signs of infection - the patient should hold the next dose of Cosentyx ?? and call your primary care provider to ensure adequate medical care.  Treatment may be resumed when infection is treated and patient is asymptomatic.  Muscle pain or weakness  Shortness of breath      Warnings, Precautions, & Contraindications     Have your bloodwork checked as you have been told by your prescriber  Talk with your doctor if you are pregnant, planning to become pregnant, or breastfeeding  Discuss the possible need for holding your dose(s) of Cosentyx ?? when a planned procedure is scheduled with the prescriber as it may  delay healing/recovery timeline       Drug/Food Interactions     Medication list reviewed in Epic. The patient was instructed to inform the care team before taking any new medications or supplements. No drug interactions identified.   If you have a latex allergy use caution when handling, the needle cap of the Cosentyx ?? prefilled syringe and the safety cap for the Cosentyx  Sensoready?? pen contains a derivative of natural rubber latex. Unoready?? pen does NOT contain latex.   Talk with you prescriber or pharmacist before receiving any live vaccinations while taking this medication and after you stop taking it      Storage, Handling Precautions, & Disposal     Store this medication in the refrigerator.  Do not freeze  May store intact Sensoready pens and 150 mg/mL prefilled syringes at <=30??C (<=86??F) for up to 4 days; may return to the refrigerator if unused  Store in original packaging, protected from light  Do not shake  Dispose of used syringes/pens in a sharps disposal container           Current Medications (including OTC/herbals), Comorbidities and Allergies     Current Medications[1]    Allergies[2]    Problem List[3]    Medication list has been reviewed and updated in Epic: Yes    Allergies have been reviewed and updated in Epic: Yes    Appropriateness of Therapy     Acute infections noted within Epic:  No active infections  Patient reported infection: None    Is the medication and dose appropriate based on diagnosis, medication list, comorbidities, allergies, medical history, patient???s ability to self-administer the medication, and therapeutic goals? Yes    Prescription has been clinically reviewed: Yes      Baseline Quality of Life Assessment      How many days over the past month did your PsA  keep you from your normal activities? For example, brushing your teeth or getting up in the morning. 0    Financial Information     Medication Assistance provided: Prior Authorization and Copay Assistance    Anticipated copay of $0 reviewed with patient. Verified delivery address.    Delivery Information     Scheduled delivery date: 08/09/23    Expected start date: 08/16/23      Medication will be delivered via Same Day Courier to the prescription address in [facility].  This shipment will not require a signature.      Explained the services we provide at [facility] and that each month we would call to set up refills.  Stressed importance of returning phone calls so that we could ensure they receive their medications in time each month.  Informed patient that we should be setting up refills 7-10 days prior to when they will run out of medication.  A pharmacist will reach out to perform a clinical assessment periodically.  Informed patient that a welcome packet, containing information about our pharmacy and other support services, a Notice of Privacy Practices, and a drug information handout will be sent.      The patient or caregiver noted above participated in the development of this care plan and knows that they can request review of or adjustments to the care plan at any time.      Patient or caregiver verbalized understanding of the above information as well as how to contact the pharmacy at [phone] option 4 with any questions/concerns.  The pharmacy is open Monday through Friday  8:30am-4:30pm.  A pharmacist is available 24/7 via pager to answer any clinical questions they may have.    Patient Specific Needs     Does the patient have any physical, cognitive, or cultural barriers? No    Does the patient have adequate living arrangements? (i.e. the ability to store and take their medication appropriately) Yes    Did you identify any home environmental safety or security hazards? No    Patient prefers to have medications discussed with  Patient     Is the patient or caregiver able to read and understand education materials at a high school level or above? Yes    Patient's primary language is  English     Is the patient high risk? No    Does the patient have an additional or emergency contact listed in their chart? Yes    SOCIAL DETERMINANTS OF HEALTH     At the [facility], we have learned that life circumstances - like trouble affording food, housing, utilities, or transportation can affect the health of many of our patients.   That is why we wanted to ask: are you currently experiencing any life circumstances that are negatively impacting your health and/or quality of life? No    Social Drivers of Health     Food Insecurity: No Food Insecurity (03/22/2022)    Hunger Vital Sign     Worried About Running Out of Food in the Last Year: Never true     Ran Out of Food in the Last Year: Never true   Tobacco Use: Low Risk  (04/21/2023)    Patient History     Smoking Tobacco Use: Never     Smokeless Tobacco Use: Never     Passive Exposure: Never   Transportation Needs: No Transportation Needs (03/22/2022)    PRAPARE - Transportation     Lack of Transportation (Medical): No     Lack of Transportation (Non-Medical): No   Alcohol Use: Not At Risk (06/04/2022)    Alcohol Use     How often do you have a drink containing alcohol?: 2 - 4 times per month     How many drinks containing alcohol do you have on a typical day when you are drinking?: 1 - 2     How often do you have 5 or more drinks on one occasion?: Never   Housing: Not on file   Physical Activity: Not on file   Utilities: Not on file   Stress: No Stress Concern Present (09/26/2019)    [employer] of Occupational Health - Occupational Stress Questionnaire     Feeling of Stress : Not at all   Interpersonal Safety: Not At Risk (06/01/2023)    Interpersonal Safety     Unsafe Where You Currently Live: No     Physically Hurt by Anyone: No Abused by Anyone: No   Substance Use: Not on file (06/06/2023)   Intimate Partner Violence: Not on file   Social Connections: Not on file   Financial Resource Strain: Low Risk  (03/22/2022)    Overall Financial Resource Strain (CARDIA)     Difficulty of Paying Living Expenses: Not hard at all   Health Literacy: Not on file   Internet Connectivity: Not on file       Would you be willing to receive help with any of the needs that you have identified today? Not applicable       Rosalynn GORMAN Kin, PharmD  [facility]  Specialty Pharmacist       [1]   Current Outpatient Medications   Medication Sig Dispense Refill    clindamycin  (CLEOCIN  T) 1 % lotion Apply topically daily. In the morning after washing with benzoyl peroxide wash 60 mL 5    EPINEPHrine (EPIPEN) 0.3 mg/0.3 mL injection       folic acid  (FOLVITE ) 1 MG tablet Take 1 tablet (1 mg total) by mouth daily. 90 tablet 3    methotrexate  2.5 MG tablet TAKE 6 TABLETS BY MOUTH ONE TIME PER WEEK 72 tablet 1    secukinumab  (COSENTYX  PEN, 2 PENS,) 150 mg/mL PnIj injection Inject the contents of 2 pens (300 mg total) under the skin every twenty-eight (28) days. 6 mL 3    tretinoin  (RETIN-A ) 0.025 % cream Apply 1 Application topically nightly. Apply twice a week. 30 g 4     No current facility-administered medications for this visit.   [2]   Allergies  Allergen Reactions    Sulfa (Sulfonamide Antibiotics) Hives and Swelling    Sulfacetamide Sodium Swelling    Sulfamethoxazole-Trimethoprim Hives and Itching    Bee Pollen Itching     Other reaction(s): Other (See Comments)  Grasses; weeds;  Reaction: Congestion; sneezing; etc.    Dog Dander Itching     Other reaction(s): Other (See Comments)  Congestion and sneezing    Dog Epithelium Allergenic Extract Itching and Rash     Congestion; sneezing    Mite Extract Itching     Other reaction(s): Other (See Comments)  Congestion; sneezing    Pollens Extract Itching     Grasses; weeds;  Reaction: Congestion; sneezing; etc.   [3]   Patient Active Problem List  Diagnosis    Seronegative arthritis    Obesity    Mild intermittent asthma without complication (HHS-HCC)    Dyspepsia    GERD (gastroesophageal reflux disease)    Hepatic steatosis    Pain of both shoulder joints    COVID-19 virus detected    Rheumatoid arthritis

## 2023-08-04 NOTE — Unmapped (Signed)
 [facility] Specialty Medication Onboarding    Specialty Medication: Cosentyx   Prior Authorization: Not Required   Financial Assistance: Yes - copay card approved as secondary   Final Copay/Day Supply: $0 / 28 days    Insurance Restrictions: Yes - max 1 month supply     Notes to Pharmacist:   Credit Card on File: not applicable  Start Date on Rx:      The triage team has completed the benefits investigation and has determined that the patient is able to fill this medication at [facility]. Please contact the patient to complete the onboarding or follow up with the prescribing physician as needed.

## 2023-08-09 NOTE — Unmapped (Signed)
 James Singh 's COSENTYX  PEN (2 PENS) 150 mg/mL Pnij injection (secukinumab ) shipment will be delayed as a result of the medication is too soon to refill until 09/01/23.     I have reached out to the patient  at [phone] and communicated the delay. We will wait for a call back from the patient to reschedule the delivery.  We have not confirmed the new delivery date.

## 2023-08-11 NOTE — Unmapped (Signed)
 ULMER DEGEN 's COSENTYX  PEN (2 PENS) 150 mg/mL Pnij injection (secukinumab ) shipment will be canceled as a result of the medication is too soon to refill until 09/01/23.     I have reached out to the patient  at [phone] and communicated the delay. We will not reschedule the medication and have removed this/these medication(s) from the work request.  We have canceled this work request.

## 2023-08-15 ENCOUNTER — Ambulatory Visit
Admit: 2023-08-15 | Discharge: 2023-08-16 | Payer: BLUE CROSS/BLUE SHIELD | Attending: Student in an Organized Health Care Education/Training Program | Primary: Student in an Organized Health Care Education/Training Program

## 2023-08-15 DIAGNOSIS — M1990 Unspecified osteoarthritis, unspecified site: Principal | ICD-10-CM

## 2023-08-15 DIAGNOSIS — M25552 Pain in left hip: Principal | ICD-10-CM

## 2023-08-15 DIAGNOSIS — G8929 Other chronic pain: Principal | ICD-10-CM

## 2023-08-15 NOTE — Unmapped (Signed)
 SPORTS MEDICINE RETURN VISIT    ASSESSMENT AND PLAN      Diagnosis ICD-10-CM Associated Orders   1. Inflammatory arthritis  M19.90 Lg Joint Inj: L hip joint     Body fluid cell count     Joint Fluid Culture      2. Chronic left hip pain  M25.552 Body fluid cell count    G89.29 Joint Fluid Culture           Left hip pain - Advanced inflammatory arthritis, following with rheumatology.  Symptoms substantially limit function and his young age represents his primary contraindication to operative intervention at this time.  --Imaging findings, further diagnostic and therapeutic options were reviewed with the patient, along with the benefits and drawbacks of each, and the patient expressed understanding  --Patient consented for ultrasound-guided aspiration and intra-articular glucocorticoid injection of the left hip joint as documented below  --Continue prescription and over-the-counter analgesics as directed    Return if symptoms worsen or fail to improve.    Procedure(s):  Consent  After discussing the various treatment options for the condition, it was agreed that aspiration and glucocorticoid injection would be the next step in treatment. The nature of and the indications for injection of glucocorticoid and local anaesthetic were reviewed in detail with the patient today. The inherent risks of injection, including infection, bleeding, allergic reaction, increased pain, incomplete relief or temporary relief of symptoms, alterations of blood glucose levels requiring careful monitoring and treatment as indicated, tendon, ligament or articular cartilage rupture or degeneration, nerve injury, skin depigmentation, and/or fatty atrophy were discussed as relevant. The patient expressed understanding and consented to proceed.  Procedure   After the risks and benefits of the procedure were explained, verbal consent was given, and a procedural time-out was performed.  Anatomic landmarks were palpated to identify the approach. The relevant anatomy was then visualized with ultrasound.  The site for the injection was properly marked and prepped with chlorhexidine solution.   The injection site was anesthetized with ethyl chloride and 1% lidocaine .   Using ultrasound guidance, the left hip joint was visualized and 7 milliliters of cloudy yellow fluid were aspirated before the joint was subsequently injected with 20 milligrams of Kenalog  (triamcinolone  acetonide) and 4 milliliters of normal saline using a sterile technique.   During the injection, there was unrestricted flow and care was taken not to inject glucocorticoid into the skin or subcutaneous tissues.  There were no complications during the procedure.  Post-procedure  A sterile adhesive bandage was applied.   Instructions were given regarding post-injection care, when to follow up in clinic and what to expect from the procedure.   The patient tolerated the procedure well and was discharged without immediate complication.    NEED FOR SONOGRAPHIC GUIDANCE  Given the complexity of this problem and the anatomic location of this structure, sonographic guidance is recommended to prevent injury to neurovascular structures and confirm accuracy of injection. The accuracy of doing these injections blind is poor and the benefit to the patient by using ultrasound guidance is significant to avoid complications.     Reference:  American Medical Society for Sports Medicine (AMSSM) position statement: interventional musculoskeletal ultrasound in sports medicine.  Garnetta ALEC Hurst MM, Adams E, Berkoff D, Concoff AL, Gladstone LELON Claudene DOROTHA Mallissa J Sports Med. 2014 Oct 20. pii: [id number]. doi: [id number]      SUBJECTIVE     Chief Complaint:   Chief Complaint   Patient presents with  Left Hip - Pain       History of Present Illness: 33 y.o. male who presents for left hip pain. History is significant for seronegative peripheral inflammatory arthritis, currently treated as psoriatic arthritis with secukinumab  and methotrexate  following treatment courses with other orthobiologics previously.  Left hip symptoms have been present for about 7 months, developing in the absence of recent or remote trauma. Pain localizes to the groin/anterior hip without radiation, exacerbated by weightbearing activity in particular, improved with prednisone  which he has not taken in a few months. He reports no swelling. He has not had prior surgery to the area. He has not had prior injections to the area.  He initially mentioned this issue to his rheumatologist and later saw Rockey Fang 06/20/2023 for this issue.  Operative intervention was not immediately recommended, and he was referred here today for additional evaluation and management.     Past Medical History: Past Medical History[1]      OBJECTIVE     Physical Exam:  Vitals:   Wt Readings from Last 3 Encounters:   06/01/23 (!) 121.1 kg (267 lb)   04/15/23 (!) 119.9 kg (264 lb 6.4 oz)   02/17/23 (!) 115.2 kg (254 lb)     Estimated body mass index is 34.28 kg/m?? as calculated from the following:    Height as of 12/20/22: 188 cm (6' 2).    Weight as of 06/01/23: 121.1 kg (267 lb).  Gen: Well-appearing male in no acute distress  MSK: LEFT HIP -  Mild TTP.  110 degrees of flexion, 20 degrees of internal rotation and 40 degrees of external rotation.  4+/5 strength.  Positive Ober, positive FABER (groin), positive FADIR, positive labral provocative testing, negative straight leg raise, negative clamshell.  Neurovascularly intact.    Imaging/other tests: X-rays of the bilateral hips 02/17/2023 demonstrate evidence of advanced arthritic change about the left hip in particular with flattening of the femoral head, decreased joint space and periarticular osteophytosis.    Left hip x-rays 05/08/2023 are limited by technique but appear to redemonstrate advanced left hip osteoarthrosis, possibly with further worsened joint space narrowing.    MRI left hip 05/18/2023 -   1. Severe left hip osteoarthrosis with bone-on-bone contact and adjacent bone marrow signal change. Degree of cartilage thinning with relative paucity of osteophytes could represent underlying inflammatory arthritis. Associated mild synovial thickening and enhancement.  2.  Mild edema and enhancement within the right gluteus minimus question strain or possibly myositis.  3.  No findings to suggest osteonecrosis.    Selected labs 06/01/2023 -   ESR 14  CRP 11.6    ADMINISTRATIVE     I have personally reviewed and interpreted the images (as available).  Point-of-care ultrasound imaging is on file and stored in a permanent location (if performed).  I have personally reviewed prior records and incorporated relevant information above (as available).    MEDICAL DECISION MAKING (level of service defined by 2/3 elements)     Number/Complexity of Problems Addressed 1 undiagnosed new problem with uncertain prognosis (99204/99214)   Amount/Complexity of Data to be Reviewed/Analyzed Independent interpretation of a test performed by another physician/other qualified health care professional (99204/99214)  3 points: Review prior notes (1 point per unique source); Review test results (1 point per unique test); Order tests (1 point per unique test); Assessment requiring an independent historian (99204/99214)  Meets at least 2 of the 3 MODERATE criteria above (99205/99215)   Risk of Complications/Morbidity/Mortality of Management Decision for MINOR Surgery (  Including Injection) WITHOUT Risk Factors (99203/99213)     TIME     Total Time for E/M Services on the Date of Encounter Time-based coding not utilized for this encounter     CONSULTATION     Consultation services provided No     MODIFIER 25 (Significant, Separately Billable Evaluation and Management)     Documentation to ensure appropriate insurance payment for medically necessary work    Per the [employer] (rev. 02/08/2021) Chapter 13, Section B. Evaluation & Management (E&M) Services, paragraph 5:   ???In general, E&M services on the same date of service as the minor surgical procedure are included in the payment for the procedure???However, a significant and separately identifiable E&M service unrelated to the decision to perform the minor surgical procedure is separately reportable with modifier 25. The E&M service and minor surgical procedure do not require different diagnoses.???    Per the American Medical Association in Reporting CPT Modifier 25 [CPT??Geophysicist/field seismologist (Online). 2023;33(11):1-12.] Page 1, Appropriate Use, paragraph 1:   ???Modifier 25 is used to indicate that a patient's condition required a significant, separately identifiable evaluation and management (E/M) service above and beyond that associated with another procedure or service being reported by the same physician or other qualified health care professional [facility]) on the same date. This service must be above and beyond the other service provided or beyond the usual preoperative and postoperative care associated with the procedure or service that was performed on that same date, and it must be substantiated by documentation in the patient's record that satisfies the relevant criteria for the respective E/M service to be reported.???    Per the American Medical Association in Reporting CPT Modifier 25 [CPT??Geophysicist/field seismologist (Online). 2023;33(11):1-12.] Page 2, Considerations, bullet point 2 (Requires awareness of usual preoperative and postoperative services):   ???Pre- and post-operative services typically associated with a procedure include the following and cannot be reported with a separate E/M services code:   Review of patient's relevant past medical history,  Assessment of the problem area to be treated by surgical or other service,  Formulation and explanation of the clinical diagnosis,  Review and explanation of the procedure to the patient, family, or caregiver,  Discussion of alternative treatments or diagnostic options,  Obtaining informed consent,  Providing postoperative care instructions,  Discussion of any further treatment and follow up after the procedure???    As the service provider for this encounter, I attest that the patient's condition required a significant, separately identifiable, medical necessary evaluation and management service in addition to the procedure performed on the same date of service. The evaluation and management service was above and beyond the usual preoperative and postoperative care associated with the procedure. The specific elements of the encounter that represent evaluation and management service above and beyond the usual preoperative and postoperative care associated with the procedure include, but are not limited to:  Reviewed the patient's relevant past surgical history, social history and family history  Reviewed the patient's medications  Reviewed the patient's allergies  Independently obtained history of present illness and relevant review of systems  Independently reviewed laboratory, imaging and/or other data  Independently interpreted laboratory, imaging and/or other data  Independently synthesized history, physical examination, laboratory, imaging and/or other data to formulate a management plan including elements separate from the procedure itself and usual postprocedural care       PROCEDURES     Lg Joint Inj: L hip joint on 08/15/2023 1:30 PM  Details: ultrasound-guided  Laterality: left  Location: hip  Medications: 20 mg triamcinolone  acetonide 10 mg/mL    Medical Care Team Attestation: All ProcDoc orders were read back and verbally confirmed with the procedure provider, including but not limited to patient name, medication name, dose, and route, before any actions were taken.  Provider Attestation: The information documented by members of my medical care team was reviewed and verified for accuracy by me.           DME     DME ORDER:  Dx: ,                          [1]   Past Medical History:  Diagnosis Date    Arthritis     RA    Asthma (HHS-HCC)     Obesity

## 2023-08-15 NOTE — Unmapped (Signed)
 You received a corticosteroid injection to reduce pain and inflammation.  Please note that it can take up to 2 weeks for this injection to fully work.  While many people will feel relief sooner, please be patient.    The injection contained a corticosteroid and a numbing agent.  The numbing agent can last for 1-6 hours.  After this wears off you may have increased pain until the steroid has a chance to work.    Unless you have been directed otherwise, you may take over-the-counter pain medications and apply ice to the affected area as needed. If you have prescription medications for treatment of your condition, you may take these according to the prescription instructions.    You can resume your normal daily activities, but consider resting the injected area for the next few days.      What are some of the possible side effects of a steroid injection?    Common side effects:  temporarily elevated blood sugar (in diabetic patients) that can last a few days   flushing of the skin, especially the face  temporary rise in blood pressure  discoloration or atrophy of the skin at the injection site    Call your doctor at once if you have:  persistent worsening pain or swelling, fever;  blurred vision, tunnel vision, eye pain, or seeing halos around lights;  fast or slow heartbeats;  increased blood pressure that is associated with severe headache, blurred vision, pounding in your neck or ears, anxiety, nosebleed;  headaches, ringing in your ears, dizziness, nausea, vision problems, pain behind your eyes    This is not a complete list of side effects and others may occur. Call your doctor for medical advice about side effects.     What other drugs may be affected after the injection?  Many drugs can interact with steroids. Not all possible interactions are listed here. Tell your doctor about all your current medicines and any you start or stop using, especially:  an antibiotic or antifungal medication;  birth control pills or hormone replacement therapy;  a blood thinner (warfarin, Coumadin, and others);  a diuretic or water pill;  insulin or oral diabetes medicine;  medicine to treat tuberculosis;  a nonsteroidal anti-inflammatory drug or NSAID (aspirin, ibuprofen, naproxen, diclofenac, indomethacin, Advil, Aleve, Celebrex, and many others); or  seizure medication.      You can reach our office through My [facility] Chart ([email or web address]) or by phone at [phone].    We will see you back as scheduled. If you need to schedule another appointment, please call [phone].    Thank you for coming to [facility] and the [facility], where we strive to provide innovative and comprehensive patient-centered care based on research evidence. We appreciate you choosing [facility] for your care, and we look forward to seeing you again for any future needs!      Sincerely,    Sabino Dick, MD               ----------      Thank you for coming to [facility] today!     We aim to provide you with the highest quality, individualized care.  If you have any unanswered questions after the visit, please do not hesitate to reach out to [place] on MyChart or leave a message for the nurse.  ?  MyChart messages: Messages sent to your provider will be checked by their clinical support staff throughout the day during  normal business hours (8:30 am-4:00 pm, Monday-Friday). Please note that responses may take up to 48 hours.  Please use this method of communication for non-urgent and non-emergent concerns, questions, refill requests or inquiries only.?Our team will help respond to all of your questions. Please note that you may be asked to see a provider either in person or by telehealth if addressing your questions would be best handled in that way.  Please keep in mind that these messages are not real time communications, so be patient when waiting for a response.    If you do not have access to MyChart, do not know how to use MyChart or have an issue that may require more extensive discussion, please call the nurses' call line: [phone].?This line is checked throughout the day and will be responded to as time allows.?Please note that return calls may take up to 48 hours, depending on the nature of the need.?  ?  If you have an issue that requires emergent attention that cannot wait, either call the Orthopaedics resident on call at [phone], consider coming to our [facility] walk-in clinic, or go to the nearest Emergency Department.    If you need to schedule future appointments, please call [phone].     We look forward to seeing you again in the future and appreciate you choosing [facility] for your care!    Thank you,    Sabino Dick, MD            We provide innovative and comprehensive patient centered care that is supported by evidence-based research                                                                                                    RESEARCH PARTICIPATION    Please check out our current research studies to see if you or someone you know may qualify at:    [email or web address]

## 2023-08-25 DIAGNOSIS — M1380 Other specified arthritis, unspecified site: Principal | ICD-10-CM

## 2023-08-25 MED ORDER — PREDNISONE 10 MG TABLET
ORAL_TABLET | ORAL | 0 refills | 18.00000 days | Status: CP
Start: 2023-08-25 — End: 2023-09-12

## 2023-08-25 NOTE — Unmapped (Signed)
 Called patient in response to message. Knee is flaring up. Says that he had his L hip injection and this helped quite a bit and he was getting off crutches. Then his knee started to swell and he is very stiff and having issues straightening it.    Unsure if the additional mechanical strain caused an inflammatory flare vs mechanical process causing swelling. Will treat with pred course.

## 2023-08-25 NOTE — Unmapped (Signed)
 Copied from CRM [id number]. Topic: Access To Clinicians - Req Clinic Call Back  >> Aug 25, 2023  9:25 AM Karna BIRCH wrote:  Pt is requesting for a urgent call back from either Dr. JAYSON or a nurse. States that he woke up with right knee swelling to the point he is unable to walk. If someone could please call Pt.

## 2023-09-01 NOTE — Unmapped (Signed)
 [facility] Delivery Pharm[facility] Refill Coordination Note    James Singh, DOB: [birth year]  Phone: [phone] (home) [phone] (work)      All above HIPAA information was verified with patient.         08/31/2023     9:02 PM   Specialty Rx Medication Refill Questionnaire   Which Medications would you like refilled and shipped? COSENTYX  PEN (2 PENS) 150 mg/mL, 0 days supply on hand   Please list all current allergies: pollen, dust mites, sulfa drugs,   Have you missed any doses in the last 30 days? No   Have you had any changes to your medication(s) since your last refill? No   How much of each medication do you have remaining at home? (eg. number of tablets, injections, etc.) 0   If receiving an injectable medication, next injection date is 09/13/2023   Have you experienced any side effects in the last 30 days? No   Please enter the full address (street address, city, state, zip code) where you would like your medication(s) to be delivered to. [address], [place], [place] [address]   Please specify on which day you would like your medication(s) to arrive. Note: if you need your medication(s) within 3 days, please call the pharmacy to schedule your order at [phone]  09/09/2023   Has your insurance changed since your last refill? No   Would you like a pharmacist to call you to discuss your medication(s)? No   Do you require a signature for your package? (Note: if we are billing Medicare Part B or your order contains a controlled substance, we will require a signature) No   I have been provided my out of pocket cost for my medication and approve the pharmacy to charge the amount to my credit card on file. Yes         Completed refill call assessment today to schedule patient's medication shipment from the [facility] [phone]).  All relevant notes have been reviewed.       Confirmed patient received a Conservation officer, historic buildings and a Surveyor, mining with first shipment. The patient will receive a drug information handout for each medication shipped and additional FDA Medication Guides as required.         REFERRAL TO PHARMACIST     Referral to the pharmacist: Not needed      [facility]     Shipping address confirmed in Epic.     Delivery Scheduled: Yes, Expected medication delivery date: 8.1.25.     Medication will be delivered via Same Day Courier to the prescription address in Epic WAM.    James Singh   [facility] Delivery Pharm[facility] Specialty Technician

## 2023-09-09 NOTE — Unmapped (Signed)
 James Singh 's COSENTYX  PEN (2 PENS) 150 mg/mL Pnij injection (secukinumab ) shipment will be delayed as a result of the medication is too soon to refill until 10/03/23. (Filled at [facility] - pt is calling to back out)     I have reached out to the patient  at [phone] and communicated the delay. We will wait for a call back from the patient to reschedule the delivery.  We have not confirmed the new delivery date.

## 2023-09-12 NOTE — Unmapped (Signed)
 Hi Dr. JAYSON    Mr. James Singh called and says that he is unable to move due to pain and swelling.Pt says his pain is at a six. Pt says that he has finished his prednisone  and his symptoms are returning.

## 2023-09-12 NOTE — Unmapped (Signed)
 Rheumatology Clinic Follow-up Note     Assessment/Plan:    James Singh is a 33 y.o.  male with a PMHx of seronegative peripheral inflammatory arthritis (hips, knees, jaw), disc disease of the cervical spine, and pontine cavernous malformation who presents today for follow-up of his inflammatory arthritis.     Today presents due to swelling and pain to R knee. Recent prednisone  taper was helpful at high doses but then symptoms returned after completing.    Pt has cystic acne and pt's joint symptoms have been resistant to multiple agents. This raises concern for inflammatory arthritis related to hidradenitis suppurativa.     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 Discussed switching to Bimzelx , Remicade or high dose of Rinvoq. We decided to proceed with Bimzelx  since cosentyx  was helpful initially but lost efficacy.   - Continue MTX 15mg  PO weekly with daily 1mg  folic acid   - Start Bimzelx  320mg  [place] q4wk with loading dose  - Reviewed risks of Bimzelx  to include immunosuppression, suicidal ideation, URI, and possible flare of IBD. Pt verbalized understanding and wishes to proceed with treatment. Provided pt with medication guide of Bimzelx  to review.   - Cont Cosentyx  to 300mg  until Bimzelx  is received.  - Update monitoring labs: CBCw/diff, Cr, AST, ALT, sed rate, CRP, and infectious disease screening.    R knee pain and swelling: low suspicion for septic knee currently  - R knee aspirated and injected with steroid by Dr. Stacia with assistance of US . Please see procedure note.    L hip pain: due to degenerative changes secondary to inflammatory arthritis.   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     F/U as scheduled in October with Dr. Stacia    I personally spent 30 minutes face-to-face and non-face-to-face in the care of this patient, which includes all pre, intra, and post visit time on the date of service.  All documented time was specific to the E/M visit and does not include any procedures that may have been performed.            Primary Care Provider: Geralene Levorn Geralds, FNP    HPI:  James Singh is a 33 y.o.  male with a PMHx of seronegative peripheral inflammatory arthritis (hips, knees, jaw), disc disease of the cervical spine, and pontine cavernous malformation who presents today for follow-up due to recent left hip pain.    Last visit: 06/01/2023 with myself    Today  History of Present Illness  He reports recent swelling and pain to R knee. His L hip has also been hurting but found to be degenerative in nature. He had steroid injection by Ortho to L hip which helped for about a month.     For the right knee, a long prednisone  taper was attempted, but swelling and pain returned after tapering to 20-30 mg. The knee remains swollen, tender, and he is unable to fully straighten it due to pain. It feels warm and is tender throughout.    He is on Cosentyx  without missing doses, methotrexate , and folic acid . He completed his last prednisone  taper last Friday. Despite these treatments, he continues to experience symptoms. No recent fever.     His left hip remains somewhat sore but has not returned to a full flare-up.  His hands and left knee are fine, with no swelling reported. No recent illness or infections, new rashes, skin changes, or eye issues.    He has a history of a rash with Humira , which resolved quickly after discontinuation. He has not had shingles but had chickenpox as a child. He drinks alcohol very limitedly and has no history of Crohn's or ulcerative colitis.    Disease History  Mr. James Singh developed inflammatory arthritis of the R knee and left hip in late 2013.  He was placed on steroids, with some improvement.  However, he flared during tapers and was subsequently placed on methotrexate .  Due to persistent hip pain despite MTX, he was started on Humira  in June 2014.   The patient has a history of terminal ileal ulcer in 2013 and has followed with GI in the past. Biopsy was consistent with acute inflammation, not IBD.   In regard to his seronegative arthritis, he is on Humira  40mg  SQ q2 week, MTX 20mg  SQ qweek, and daily folic. He flared in 2018 when trying to taper MTX.  MTX was decreased to 15mg  weekly in Sept 2020 due to LFT elevated thought to be related to NASH and Tylenol use. Humira  was stopped in Nov 2021 when he developed cutaneous leukocytoclastic vasculitis.  Actemra  was started in January 2022 as an alternative biologic therapy. He had a flare in July 2022 with R knee & L hip involvement and was switched to Xeljanz .  Due to inadequate control, Xeljanz  was switched to Cosentxy (first started 12/2021). He is currently on MTX 15mg  weekly and Cosentyx .    Review of Systems:  Positive findings noted above, otherwise a 14 point review of systems was reviewed and negative    Past Medical, Surgical, Family and Social History reviewed and updated per EMR     Allergies:  Sulfa (sulfonamide antibiotics), Sulfacetamide sodium, Sulfamethoxazole-trimethoprim, Bee pollen, Dog dander, Dog epithelium allergenic extract, Mite extract, and Pollens extract    Medications:     Current Outpatient Medications:     clindamycin  (CLEOCIN  T) 1 % lotion, Apply topically daily. In the morning after washing with benzoyl peroxide wash, Disp: 60 mL, Rfl: 5    EPINEPHrine (EPIPEN) 0.3 mg/0.3 mL injection, , Disp: , Rfl:     folic acid  (FOLVITE ) 1 MG tablet, Take 1 tablet (1 mg total) by mouth daily., Disp: 90 tablet, Rfl: 3    methotrexate  2.5 MG tablet, TAKE 6 TABLETS BY MOUTH ONE TIME PER WEEK, Disp: 72 tablet, Rfl: 1    secukinumab  (COSENTYX  PEN, 2 PENS,) 150 mg/mL PnIj injection, Inject the contents of 2 pens (300 mg total) under the skin every twenty-eight (28) days., Disp: 6 mL, Rfl: 3    tretinoin  (RETIN-A ) 0.025 % cream, Apply 1 Application topically nightly. Apply twice a week., Disp: 30 g, Rfl: 4      Objective   Vitals:    09/14/23 1357   BP: 143/100   Pulse: 131   Temp: 36.1 ??C (97 ??F)   TempSrc: Temporal   Weight: (!) 118.1 kg (260 lb 6.4 oz)       Physical Exam  General: Well appearing  Skin: No petechial rash, cystic acne on face.   Musculoskeletal:   No swelling or tenderness of the hands, wrists, elbows. Normal range of motion of the shoulders.  Right knee is status post prior surgery, swelling and very tender, warm to touch, dec ROM. No left knee swelling or tenderness.  No swelling/bogginess or TTP of any  MTPs  Neurologic: Alert and conversing normally.  5/5 strength throughout.

## 2023-09-14 ENCOUNTER — Encounter: Admit: 2023-09-14 | Discharge: 2023-09-15 | Payer: BLUE CROSS/BLUE SHIELD | Attending: Family | Primary: Family

## 2023-09-14 ENCOUNTER — Ambulatory Visit: Admit: 2023-09-14 | Discharge: 2023-09-15 | Payer: BLUE CROSS/BLUE SHIELD | Attending: Family | Primary: Family

## 2023-09-14 ENCOUNTER — Encounter
Admit: 2023-09-14 | Discharge: 2023-09-15 | Payer: BLUE CROSS/BLUE SHIELD | Attending: Student in an Organized Health Care Education/Training Program | Primary: Student in an Organized Health Care Education/Training Program

## 2023-09-14 DIAGNOSIS — L4050 Arthropathic psoriasis, unspecified: Principal | ICD-10-CM

## 2023-09-14 DIAGNOSIS — Z119 Encounter for screening for infectious and parasitic diseases, unspecified: Principal | ICD-10-CM

## 2023-09-14 DIAGNOSIS — Z5181 Encounter for therapeutic drug level monitoring: Principal | ICD-10-CM

## 2023-09-14 DIAGNOSIS — M1380 Other specified arthritis, unspecified site: Principal | ICD-10-CM

## 2023-09-14 DIAGNOSIS — Z79631 Encounter for methotrexate monitoring: Principal | ICD-10-CM

## 2023-09-14 LAB — CBC W/ AUTO DIFF
BASOPHILS ABSOLUTE COUNT: 0 10*9/L (ref 0.0–0.1)
BASOPHILS RELATIVE PERCENT: 0.6 %
EOSINOPHILS ABSOLUTE COUNT: 0.1 10*9/L (ref 0.0–0.5)
EOSINOPHILS RELATIVE PERCENT: 1.2 %
HEMATOCRIT: 40.6 % (ref 39.0–48.0)
HEMOGLOBIN: 14.6 g/dL (ref 12.9–16.5)
LYMPHOCYTES ABSOLUTE COUNT: 1.1 10*9/L (ref 1.1–3.6)
LYMPHOCYTES RELATIVE PERCENT: 16.3 %
MEAN CORPUSCULAR HEMOGLOBIN CONC: 35.8 g/dL (ref 32.0–36.0)
MEAN CORPUSCULAR HEMOGLOBIN: 30.1 pg (ref 25.9–32.4)
MEAN CORPUSCULAR VOLUME: 84 fL (ref 77.6–95.7)
MEAN PLATELET VOLUME: 8 fL (ref 6.8–10.7)
MONOCYTES ABSOLUTE COUNT: 0.7 10*9/L (ref 0.3–0.8)
MONOCYTES RELATIVE PERCENT: 10.8 %
NEUTROPHILS ABSOLUTE COUNT: 4.9 10*9/L (ref 1.8–7.8)
NEUTROPHILS RELATIVE PERCENT: 71.1 %
PLATELET COUNT: 236 10*9/L (ref 150–450)
RED BLOOD CELL COUNT: 4.84 10*12/L (ref 4.26–5.60)
RED CELL DISTRIBUTION WIDTH: 15.3 % — ABNORMAL HIGH (ref 12.2–15.2)
WBC ADJUSTED: 6.9 10*9/L (ref 3.6–11.2)

## 2023-09-14 LAB — CREATININE
CREATININE: 0.95 mg/dL (ref 0.73–1.18)
EGFR CKD-EPI (2021) MALE: >90 mL/min/1.73m2 (ref >=60)

## 2023-09-14 LAB — SEDIMENTATION RATE: ERYTHROCYTE SEDIMENTATION RATE: 75 mm/h — ABNORMAL HIGH (ref 0–15)

## 2023-09-14 LAB — AST: AST (SGOT): 16 U/L (ref <=34)

## 2023-09-14 LAB — C-REACTIVE PROTEIN: C-REACTIVE PROTEIN: >100 mg/L — ABNORMAL HIGH (ref <=10.0)

## 2023-09-14 LAB — ALT: ALT (SGPT): 19 U/L (ref 10–49)

## 2023-09-14 MED ORDER — PREDNISONE 10 MG TABLET
ORAL_TABLET | ORAL | 0 refills | 18.00000 days | Status: CN
Start: 2023-09-14 — End: 2023-10-02

## 2023-09-14 MED ADMIN — lidocaine (XYLOCAINE) 10 mg/mL (1 %) injection 2 mL: 2 mL | @ 19:00:00 | Stop: 2023-09-14

## 2023-09-14 MED ADMIN — methylPREDNISolone acetate (DEPO-Medrol) injection 80 mg: 80 mg | INTRA_ARTICULAR | @ 19:00:00 | Stop: 2023-09-14

## 2023-09-14 NOTE — Unmapped (Signed)
 PROCEDURE NOTE: After a time out and discussion of risks and benefits, including the option for continued conservative management, the patient gave verbal consent for a  right knee injection. Risks discussed included infection (1:5000), pain, bleeding and damage to local structures. A time out was completed confirming patient identification, consent, and location of procedure. The  right knee was identified and marked.  This area was prepped with betadine and ethyl chloride was used for topical anesthesia. 20cc of cloudy joint fluid was aspirated with a 20g needle. 2mL of 1% lidocaine  was injected with 80mg  of depomedrol was then injected into this site.  The patient tolerated the procedure well and there were no immediate complications. Patient advised on post-injection flare management and iatrogenic septic joint signs/symptoms requiring immediate medical attention.

## 2023-09-15 MED ORDER — BIMZELX AUTOINJECTOR 320 MG/2 ML SUBCUTANEOUS AUTO-INJECTOR
SUBCUTANEOUS | 0 refills | 0.00000 days | Status: CP
Start: 2023-09-15 — End: 2024-01-06

## 2023-09-15 MED ORDER — BIMZELX AUTOINJECTOR 160 MG/ML SUBCUTANEOUS AUTO-INJECTOR
SUBCUTANEOUS | 3 refills | 0.00000 days | Status: CP
Start: 2023-09-15 — End: 2023-09-15

## 2023-09-15 NOTE — Unmapped (Signed)
 [facility] 's Cosentyx  shipment will be canceled as a result of the medication is too soon to refill until 10/03/23. Filled at [facility], pt stated they would call to have claim backed out  on 7/31 so [facility] could fill. Claim has not been backed out and we have been unable to reach pt after multiple attempts.    I have reached out to the patient  at [phone] and left a voicemail message.  We will not reschedule the medication and have removed this/these medication(s) from the work request.  We have canceled this work request.

## 2023-09-16 DIAGNOSIS — M1380 Other specified arthritis, unspecified site: Principal | ICD-10-CM

## 2023-09-16 DIAGNOSIS — L4050 Arthropathic psoriasis, unspecified: Principal | ICD-10-CM

## 2023-09-16 DIAGNOSIS — L709 Acne, unspecified: Principal | ICD-10-CM

## 2023-09-16 DIAGNOSIS — L732 Hidradenitis suppurativa: Principal | ICD-10-CM

## 2023-09-16 DIAGNOSIS — L88 Pyoderma gangrenosum: Principal | ICD-10-CM

## 2023-09-16 LAB — QUANTIFERON TB GOLD PLUS
QUANTIFERON ANTIGEN 1 MINUS NIL: -0.01 [IU]/mL
QUANTIFERON ANTIGEN 2 MINUS NIL: -0.01 [IU]/mL
QUANTIFERON MITOGEN: 3.79 [IU]/mL
QUANTIFERON TB GOLD PLUS: NEGATIVE
QUANTIFERON TB NIL VALUE: 0.02 [IU]/mL

## 2023-09-16 LAB — HEPATITIS B SURFACE ANTIBODY
HEPATITIS B SURFACE ANTIBODY QUANT: <8 m[IU]/mL (ref <8.00)
HEPATITIS B SURFACE ANTIBODY: NONREACTIVE

## 2023-09-16 LAB — HIV ANTIGEN/ANTIBODY COMBO: HIV ANTIGEN/ANTIBODY COMBO: NONREACTIVE

## 2023-09-16 LAB — TB AG2: TB AG2 VALUE: 0.01

## 2023-09-16 LAB — HEPATITIS B SURFACE ANTIGEN: HEPATITIS B SURFACE ANTIGEN: NONREACTIVE

## 2023-09-16 LAB — HEPATITIS C ANTIBODY
HCV S/CO VALUE: 0.07
HEPATITIS C ANTIBODY: NONREACTIVE

## 2023-09-16 LAB — HEPATITIS B CORE ANTIBODY, TOTAL: HEPATITIS B CORE TOTAL ANTIBODY: NONREACTIVE

## 2023-09-16 LAB — TB NIL: TB NIL VALUE: 0.02

## 2023-09-16 LAB — TB MITOGEN: TB MITOGEN VALUE: 3.81

## 2023-09-16 LAB — TB AG1: TB AG1 VALUE: 0.01

## 2023-09-16 MED ORDER — BIMZELX AUTOINJECTOR 320 MG/2 ML SUBCUTANEOUS AUTO-INJECTOR
SUBCUTANEOUS | 0 refills | 0.00000 days | Status: CP
Start: 2023-09-16 — End: 2024-01-07

## 2023-09-16 NOTE — Unmapped (Signed)
 The [facility] has received the prescription(s) for BIMZELX  AUTOINJECTOR 320 mg/2 mL Atin (bimekizumab -bkzx). The triage team has completed the benefits investigation and has determined that the patient is NOT able to fill this medication at the [facility] due to insurance plan limitations. Please see additional information below and re-route the prescription to the preferred pharmacy. Thank you.    PA Required: Yes    Specialty Pharmacy Required:  [facility] - Phone:[phone] and Fax: [phone]    Please note that due to Board of Pharmacy restrictions the authorizing provider must reorder to the pharmacy listed above due to insurance preference

## 2023-09-26 ENCOUNTER — Emergency Department
Admit: 2023-09-26 | Discharge: 2023-09-26 | Disposition: A | Discharge status: Home / Self Care | Payer: BLUE CROSS/BLUE SHIELD

## 2023-09-26 DIAGNOSIS — M25561 Pain in right knee: Principal | ICD-10-CM

## 2023-09-26 LAB — BASIC METABOLIC PANEL
ANION GAP: 12 mmol/L (ref 5–14)
BLOOD UREA NITROGEN: 8 mg/dL — ABNORMAL LOW (ref 9–23)
BUN / CREAT RATIO: 8
CALCIUM: 9.9 mg/dL (ref 8.7–10.4)
CHLORIDE: 98 mmol/L (ref 98–107)
CO2: 28 mmol/L (ref 20.0–31.0)
CREATININE: 1.06 mg/dL (ref 0.73–1.18)
EGFR CKD-EPI (2021) MALE: >90 mL/min/1.73m2 (ref >=60)
GLUCOSE RANDOM: 122 mg/dL (ref 70–179)
POTASSIUM: 4.1 mmol/L (ref 3.4–4.8)
SODIUM: 138 mmol/L (ref 135–145)

## 2023-09-26 LAB — SEDIMENTATION RATE: ERYTHROCYTE SEDIMENTATION RATE: 66 mm/h — ABNORMAL HIGH (ref 0–15)

## 2023-09-26 LAB — CBC W/ AUTO DIFF
BASOPHILS ABSOLUTE COUNT: 0.1 10*9/L (ref 0.0–0.1)
BASOPHILS RELATIVE PERCENT: 0.6 %
EOSINOPHILS ABSOLUTE COUNT: 0.1 10*9/L (ref 0.0–0.5)
EOSINOPHILS RELATIVE PERCENT: 0.6 %
HEMATOCRIT: 40.2 % (ref 39.0–48.0)
HEMOGLOBIN: 14.2 g/dL (ref 12.9–16.5)
LYMPHOCYTES ABSOLUTE COUNT: 1 10*9/L — ABNORMAL LOW (ref 1.1–3.6)
LYMPHOCYTES RELATIVE PERCENT: 11.2 %
MEAN CORPUSCULAR HEMOGLOBIN CONC: 35.3 g/dL (ref 32.0–36.0)
MEAN CORPUSCULAR HEMOGLOBIN: 29.9 pg (ref 25.9–32.4)
MEAN CORPUSCULAR VOLUME: 84.6 fL (ref 77.6–95.7)
MEAN PLATELET VOLUME: 8 fL (ref 6.8–10.7)
MONOCYTES ABSOLUTE COUNT: 0.7 10*9/L (ref 0.3–0.8)
MONOCYTES RELATIVE PERCENT: 8.2 %
NEUTROPHILS ABSOLUTE COUNT: 6.8 10*9/L (ref 1.8–7.8)
NEUTROPHILS RELATIVE PERCENT: 79.4 %
PLATELET COUNT: 233 10*9/L (ref 150–450)
RED BLOOD CELL COUNT: 4.75 10*12/L (ref 4.26–5.60)
RED CELL DISTRIBUTION WIDTH: 16.1 % — ABNORMAL HIGH (ref 12.2–15.2)
WBC ADJUSTED: 8.6 10*9/L (ref 3.6–11.2)

## 2023-09-26 LAB — C-REACTIVE PROTEIN: C-REACTIVE PROTEIN: 212 mg/L — ABNORMAL HIGH (ref <=10.0)

## 2023-09-26 MED ADMIN — acetaminophen (TYLENOL) tablet 1,000 mg: 1000 mg | ORAL | @ 18:00:00 | Stop: 2023-09-26

## 2023-09-26 MED ADMIN — ketorolac (TORADOL) injection 15 mg: 15 mg | INTRAVENOUS | @ 18:00:00 | Stop: 2023-09-26

## 2023-09-26 MED ADMIN — lidocaine (PF) (XYLOCAINE-MPF) 10 mg/mL (1 %) injection 20 mL: 20 mL | @ 18:00:00 | Stop: 2023-09-26

## 2023-09-26 NOTE — Unmapped (Signed)
 [facility]  Emergency Department Provider Note     ED Clinical Impression     Final diagnoses:   None      Impression, Medical Decision Making, ED Course     Impression: 33 y.o. male with PMH most significant for rheumatoid arthritis on methotrexate  and Cosentyx  who presents to the ED with worsening right knee swelling and pain.    DDx/MDM: Based on physical exam and history there is low concern for a septic joint at this time.  The patient states had a steroid injection to the right knee 9 days ago and stated that the knee had similar pain and swelling prior to receiving that injection.  Workup included CBC and BMP which were nonconcerning.  The ESR was elevated to 66 and CRP was 212.    After discussion with the patient we will plan to obtain a right knee joint aspiration with fluid analysis.  See the procedure note for further details.  10 cc of right knee joint fluid were obtained and sent for cell count, culture, crystal examination and Gram stain.  The results demonstrated 31,000 nucleated cells and was yellow, opaque.  Based on this there is low suspicion for a septic joint and increase suspicion for rheumatoid arthritis likely causing his current symptoms.  We discussed with the patient that he should plan to contact his rheumatologist tomorrow to discuss potential treatment options.  The patient expressed understanding of this plan and was discharged from the emergency department..    No orders of the defined types were placed in this encounter.      ED Course as of 09/26/23 1856   Mon Sep 26, 2023   1448 Sedimentation rate, manual(!):    Sed Rate 66(!)   1448 C-reactive protein(!):    CRP 212.0(!)   1448 CBC w/ Differential(!):    WBC 8.6   RBC 4.75   HGB 14.2   HCT 40.2   MCV 84.6   MCH 29.9   MCHC 35.3   RDW 16.1(!)   MPV 8.0   Platelet 233   Neutrophils % 79.4   Lymphocytes % 11.2   Monocytes % 8.2   Eosinophils % 0.6   Basophils % 0.6   Absolute Neutrophils 6.8   Absolute Lymphocytes 1.0(!) Absolute Monocytes  0.7   Absolute Eosinophils 0.1   Absolute Basophils  0.1   Anisocytosis Slight(!)   1448 Basic Metabolic Panel(!):    Sodium 138   Potassium 4.1   Chloride 98   CO2 28.0   Anion Gap 12   Bun 8(!)   Creatinine 1.06   BUN/Creatinine Ratio 8   eGFR CKD-EPI (2021) Male >90   Glucose 122   Calcium 9.9   1820 Joint Fluid Culture:    Gram Stain Result Direct Specimen Gram Stain   Gram Stain Result 2+ Polymorphonuclear leukocytes   Gram Stain Result No organisms seen     ____________________________________________    The case was discussed with the attending physician, who is in agreement with the above assessment and plan.      History     Chief Complaint  Chief Complaint   Patient presents with    Knee Pain       HPI   James Singh is a 33 y.o. male with past medical history as below who presents with right knee pain and swelling.  The patient reports that this began 2 days ago.  He states that he has a history of rheumatoid arthritis  that had initially affected his hips and now is primarily affecting his right knee.  The patient states that he has been having problems with the knee for 1 month and recently underwent a steroid injection into the right knee 9 days ago.  The patient reports that before the injection he had similar swelling and pain compared to what he is experiencing today.  He reports after the injection he did have some improvement in his pain and swelling, however, today the pain and swelling has returned to where it was at prior to receiving the injection initially.  Patient states that he was instructed by his rheumatologist to present to the ED if his pain returned after undergoing the steroid injection.  The patient denies any fevers or chills.  He denies any numbness or tingling.  He reports that he is limited ability to bear weight on the leg secondary to pain.    Past Medical History[1]    Past Surgical History[2]    Active Medications[3]     Allergies[4]    Family History[5]    Short Social History[6]     Physical Exam     VITAL SIGNS:      Vitals:    09/26/23 1119 09/26/23 1126   BP:  143/96   Pulse: 78 124   Resp:  18   Temp:  36.9 ??C (98.4 ??F)   TempSrc:  Oral   SpO2: 98%    Weight:  (!) 117.9 kg (260 lb)       Constitutional: Alert and oriented. No acute distress.  Eyes: Conjunctivae are normal.  HEENT: Normocephalic and atraumatic. Conjunctivae clear. No congestion. Moist mucous membranes.   Cardiovascular: Rate as above, regular rhythm. r.  Respiratory: Normal respiratory effort.   Genitourinary: Deferred.  Musculoskeletal: Moderate edema about right knee.  Right knee is warm to touch and non-erythematous.  No evidence of drainage.  Patient able to passively extend leg to 175 degrees and flex to 80 degrees.  Neurologic: Sensation intact to light touch grossly in right lower extremity.     Radiology     No orders to display       Pertinent labs & imaging results that were available during my care of the patient were independently interpreted by me and considered in my medical decision making (see chart for details).    Portions of this record have been created using Scientist, clinical (histocompatibility and immunogenetics). Dictation errors have been sought, but may not have been identified and corrected.           [1]   Past Medical History:  Diagnosis Date    Arthritis     RA    Asthma (HHS-HCC)     Obesity    [2]   Past Surgical History:  Procedure Laterality Date    [facility] HISTORICAL RESULT)      KNEE ARTHROPLASTY Right     NECK INJECTION      STERLID - L4 OR L5    PR UPPER GI ENDOSCOPY,BIOPSY N/A 11/09/2018    Procedure: UGI ENDOSCOPY; WITH BIOPSY, SINGLE OR MULTIPLE;  Surgeon: Arlean Obadiah Spitz, MD;  Location: [facility] PROCED[facility];  Service: Gastroenterology   [3]   No current facility-administered medications for this encounter.     Current Outpatient Medications   Medication Sig Dispense Refill    bimekizumab -bkzx (BIMZELX  AUTOINJECTOR) 320 mg/2 mL AtIn Inject the contents of 1 pen (320 mg) under the skin every twenty-eight (28) days. 3 mL 3  bimekizumab -bkzx (BIMZELX  AUTOINJECTOR) 320 mg/2 mL AtIn Inject 320 mg under the skin every fourteen (14) days for 9 doses. 18 mL 0    clindamycin  (CLEOCIN  T) 1 % lotion Apply topically daily. In the morning after washing with benzoyl peroxide wash 60 mL 5    EPINEPHrine (EPIPEN) 0.3 mg/0.3 mL injection       folic acid  (FOLVITE ) 1 MG tablet Take 1 tablet (1 mg total) by mouth daily. 90 tablet 3    methotrexate  2.5 MG tablet TAKE 6 TABLETS BY MOUTH ONE TIME PER WEEK 72 tablet 1    secukinumab  (COSENTYX  PEN, 2 PENS,) 150 mg/mL PnIj injection Inject the contents of 2 pens (300 mg total) under the skin every twenty-eight (28) days. 6 mL 3    tretinoin  (RETIN-A ) 0.025 % cream Apply 1 Application topically nightly. Apply twice a week. 30 g 4   [4]   Allergies  Allergen Reactions    Sulfa (Sulfonamide Antibiotics) Hives and Swelling    Sulfacetamide Sodium Swelling    Sulfamethoxazole-Trimethoprim Hives and Itching    Bee Pollen Itching     Other reaction(s): Other (See Comments)  Grasses; weeds;  Reaction: Congestion; sneezing; etc.    Dog Dander Itching     Other reaction(s): Other (See Comments)  Congestion and sneezing    Dog Epithelium Allergenic Extract Itching and Rash     Congestion; sneezing    Mite Extract Itching     Other reaction(s): Other (See Comments)  Congestion; sneezing    Pollens Extract Itching     Grasses; weeds;  Reaction: Congestion; sneezing; etc.   [5]   Family History  Problem Relation Age of Onset    Neuropathy Mother         damaged facial nerve    Hypertension Mother     No Known Problems Father     Mental illness Brother     Heart disease Maternal Aunt     Aortic aneurysm Maternal Aunt         abdominal    Heart disease Maternal Uncle     Aortic aneurysm Maternal Uncle         abdominal    Heart disease Maternal Grandmother     Hyperlipidemia Maternal Grandmother     Hypertension Maternal Grandmother     Stroke Maternal Grandmother     Aortic aneurysm Maternal Grandmother         abdominal    Heart disease Maternal Grandfather     Hyperlipidemia Maternal Grandfather     Hypertension Maternal Grandfather     Stroke Paternal Grandfather     Depression Brother     Substance Abuse Disorder Neg Hx    [6]   Social History  Tobacco Use    Smoking status: Never     Passive exposure: Never    Smokeless tobacco: Never   Vaping Use    Vaping status: Never Used   Substance Use Topics    Alcohol use: Yes     Alcohol/week: 4.0 standard drinks of alcohol     Types: 4 Shots of liquor per week     Comment: 1-2 drinks 2-4 times a month    Drug use: Never        Montgomery Bernardino SQUIBB, MD  Resident  09/26/23 [phone]

## 2023-09-26 NOTE — Unmapped (Signed)
 Spoke to pharmacist and clarification given

## 2023-09-26 NOTE — Unmapped (Signed)
Pt ambulatory at discharge, pt given hard copy of discharge paperwork, verbalized understanding.

## 2023-09-26 NOTE — Unmapped (Signed)
 Patient called in saying his knee swelled up again 10 days after steroid shot. Wants. advice on what to do. Should he go to the ER.    Thanks   [place]

## 2023-09-26 NOTE — Unmapped (Signed)
 Pt c/o R knee pain and swelling. Hx of RA.

## 2023-09-26 NOTE — Unmapped (Signed)
 PA request received for medication Bimzelx  . Dot phrase with MAP requested information routed via staff message to MAP pool. See referral tab for updates.

## 2023-09-26 NOTE — Unmapped (Signed)
 Pt arrives c/o atraumatic right knee pain. Pt report pain has been ongoing for 3 months. Pt is ambulatory with crutches.

## 2023-09-26 NOTE — Unmapped (Signed)
 Pharmacy sent in fax asking for rationale regarding the patients medication. Cosentyx  was shipped out on 8/4 but wants to know if the patient will be switching to Bimzelx . In order to process the prescription they are asking that the prescriber contact them or send in a new Rx.     [facility] [phone]) option 1     Thanks  [place]

## 2023-09-26 NOTE — Unmapped (Signed)
 ED Procedure Note    Arthrocentesis    Date/Time: 09/26/2023 2:15 PM    Performed by: Montgomery Bernardino SQUIBB, MD  Authorized by: Derfler, Mabel Dover, MD  Consent: Verbal consent obtained. Written consent obtained  Risks and benefits: risks, benefits and alternatives were discussed  Consent given by: patient  Patient understanding: patient states understanding of the procedure being performed  Patient consent: the patient's understanding of the procedure matches consent given  Procedure consent: procedure consent matches procedure scheduled  Relevant documents: relevant documents present and verified  Site marked: the operative site was marked  Patient identity confirmed: verbally with patient  Indications: joint swelling and pain   Body area: knee  Joint: right knee  Local anesthesia used: yes    Anesthesia:  Local anesthesia used: yes  Local Anesthetic: lidocaine  1% with epinephrine    Sedation:  Patient sedated: no    Needle size: 18 G  Ultrasound guidance: no  Approach: lateral  Aspirate: serous and yellow  Patient tolerance: patient tolerated the procedure well with no immediate complications  Comments: Right knee joint aspiration was performed.  The area was palpated and then thoroughly cleaned with Betadine prep.  5 cc of lidocaine  was applied to the injection site.  An 18-gauge needle was then inserted using a lateral approach to the knee.  Negative pressure was applied to the back of a 10 cc syringe and fluid was collected.  10 total cc of fluid was drained from the right knee.  The needle was removed pressure was applied to the insertion site and a bandage was applied over the aspirate site.

## 2023-09-27 NOTE — Unmapped (Signed)
 Rheumatology Clinic Follow-up Note     Assessment/Plan:    James Singh is a 33 y.o.  male with a PMHx of seronegative peripheral inflammatory arthritis (hips, knees, jaw), disc disease of the cervical spine, and pontine cavernous malformation who presents today for follow-up of his inflammatory arthritis.     Today presents due to recurrence of R knee pain and swelling. He was seen in ER earlier in the week and R knee was aspirated. Culture of joint fluid shows no growth.     Pt has cystic acne and pt's joint symptoms have been resistant to multiple agents. This raises concern for inflammatory arthritis related to hidradenitis suppurativa.     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 Bimzelx  was just approved today but pt hasn't received yet.  - Continue MTX 15mg  PO weekly with daily 1mg  folic acid   - Continue plan to start Bimzelx  320mg  Williams q4wk with loading dose  - Monitoring labs from 09/26/23 and 09/14/23 reviewed.   - Agreed to complete FMLA paperwork for missed work due to recurrent flare to R knee recently. Pt will send these forms to us .    R knee pain and swelling: no growth from joint fluidx2  - Medrol  taper since prednisone  hasn't been effective. Reviewed medrol  taper with Dr. Stacia, Attending Rheumatologist.   - Start Bimzelx  as discussed above    L hip pain: due to degenerative changes secondary to inflammatory arthritis.   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     F/U as scheduled in October with Dr. Stacia    I personally spent 33 minutes face-to-face and non-face-to-face in the care of this patient, which includes all pre, intra, and post visit time on the date of service.  All documented time was specific to the E/M visit and does not include any procedures that may have been performed.                  Primary Care Provider: Geralene Levorn Geralds, FNP    HPI:  James Singh is a 33 y.o.  male with a PMHx of seronegative peripheral inflammatory arthritis (hips, knees, jaw), disc disease of the cervical spine, and pontine cavernous malformation who presents today for follow-up due to recent left hip pain.    Last visit: 09/14/23 with myself    Today  History of Present Illness  He experienced a flare-up of his right knee, which began on Saturday. He visited the ER where fluid was aspirated from the knee. Despite this, the knee remains painful, more so than before. He has not sustained any injury to the knee. He is not currently on any steroids and has been using Tylenol  at home, which has not alleviated the pain.    He last received Cosentyx  on August 5th, and his next dose was due on September 2nd. He heard today Bimzelx  was approved but needs to call pharmacy to schedule delivery. He has previously been on a prednisone  taper, starting at 60 mg and tapering down to 20 mg, but the symptoms returned quickly after completion. He expresses frustration with the limited effectiveness of prednisone , stating 'we keep throwing prednisone  at it and it just keeps bouncing back almost instantaneous.'    He also reports aching in the left hip, which  he attributes to compensating for the right knee. No fever, but he has a constant headache, fatigue, nausea, and loss of appetite since the knee swelling began. He experiences acne flare-ups primarily on his face, with occasional acne in the groin area. He describes the facial acne as large cysts.    He is working from home and has used his last PTO day due to his condition. He is considering discussing FMLA with his HR department due to frequent work absences.    Disease History  James Singh developed inflammatory arthritis of the R knee and left hip in late 2013.  He was placed on steroids, with some improvement.  However, he flared during tapers and was subsequently placed on methotrexate .  Due to persistent hip pain despite MTX, he was started on Humira  in June 2014.   The patient has a history of terminal ileal ulcer in 2013 and has followed with GI in the past. Biopsy was consistent with acute inflammation, not IBD.   In regard to his seronegative arthritis, he is on Humira  40mg  SQ q2 week, MTX 20mg  SQ qweek, and daily folic. He flared in 2018 when trying to taper MTX.  MTX was decreased to 15mg  weekly in Sept 2020 due to LFT elevated thought to be related to NASH and Tylenol  use. Humira  was stopped in Nov 2021 when he developed cutaneous leukocytoclastic vasculitis.  Actemra  was started in January 2022 as an alternative biologic therapy. He had a flare in July 2022 with R knee & L hip involvement and was switched to Xeljanz .  Due to inadequate control, Xeljanz  was switched to Cosentxy (first started 12/2021). He is currently on MTX 15mg  weekly and Cosentyx .    Review of Systems:  Positive findings noted above, otherwise a 14 point review of systems was reviewed and negative    Past Medical, Surgical, Family and Social History reviewed and updated per EMR     Allergies:  Sulfa (sulfonamide antibiotics), Sulfacetamide sodium, Sulfamethoxazole-trimethoprim, Bee pollen, Dog dander, Dog epithelium allergenic extract, Mite extract, and Pollens extract    Medications:     Current Outpatient Medications:     bimekizumab -bkzx (BIMZELX  AUTOINJECTOR) 320 mg/2 mL AtIn, Inject the contents of 1 pen (320 mg) under the skin every twenty-eight (28) days., Disp: 3 mL, Rfl: 3    bimekizumab -bkzx (BIMZELX  AUTOINJECTOR) 320 mg/2 mL AtIn, Inject 320 mg under the skin every fourteen (14) days for 9 doses., Disp: 18 mL, Rfl: 0    clindamycin  (CLEOCIN  T) 1 % lotion, Apply topically daily. In the morning after washing with benzoyl peroxide wash, Disp: 60 mL, Rfl: 5    EPINEPHrine (EPIPEN) 0.3 mg/0.3 mL injection, , Disp: , Rfl: folic acid  (FOLVITE ) 1 MG tablet, Take 1 tablet (1 mg total) by mouth daily., Disp: 90 tablet, Rfl: 3    methotrexate  2.5 MG tablet, TAKE 6 TABLETS BY MOUTH ONE TIME PER WEEK, Disp: 72 tablet, Rfl: 1    predniSONE  (DELTASONE ) 10 MG tablet, Take 6 tablets (60 mg total) by mouth daily for 3 days, THEN 4 tablets (40 mg total) daily for 3 days, THEN 3 tablets (30 mg total) daily for 3 days, THEN 2 tablets (20 mg total) daily for 3 days, THEN 1 tablet (10 mg total) daily for 3 days, THEN 0.5 tablets (5 mg total) daily for 3 days., Disp: 50 tablet, Rfl: 0    secukinumab  (COSENTYX  PEN, 2 PENS,) 150 mg/mL PnIj injection, Inject the contents of 2 pens (300 mg total) under the  skin every twenty-eight (28) days., Disp: 6 mL, Rfl: 3    tretinoin  (RETIN-A ) 0.025 % cream, Apply 1 Application topically nightly. Apply twice a week., Disp: 30 g, Rfl: 4  No current facility-administered medications for this visit.      Objective   Vitals:    09/28/23 1420   BP: 137/99   BP Position: Sitting   Pulse: 123   Temp: 36.1 ??C (97 ??F)   TempSrc: Temporal   Weight: (!) 117.9 kg (260 lb)         Physical Exam  General: Well appearing  Skin: No petechial rash, cystic acne on face.   Musculoskeletal:   No swelling or tenderness of the hands, wrists, elbows. Normal range of motion of the shoulders.  Right knee is status post prior surgery, swelling and very tender, warm to touch, dec ROM. No left knee swelling or tenderness.  No swelling/bogginess or TTP of any MTPs  Neurologic: Alert and conversing normally.  5/5 strength throughout.

## 2023-09-28 ENCOUNTER — Ambulatory Visit: Admit: 2023-09-28 | Discharge: 2023-09-28 | Payer: BLUE CROSS/BLUE SHIELD | Attending: Family | Primary: Family

## 2023-09-28 DIAGNOSIS — Z79631 Encounter for methotrexate monitoring: Principal | ICD-10-CM

## 2023-09-28 DIAGNOSIS — M1380 Other specified arthritis, unspecified site: Principal | ICD-10-CM

## 2023-09-28 DIAGNOSIS — Z5181 Encounter for therapeutic drug level monitoring: Principal | ICD-10-CM

## 2023-09-28 MED ORDER — METHYLPREDNISOLONE 4 MG TABLET
ORAL_TABLET | ORAL | 0 refills | 63.00000 days | Status: CP
Start: 2023-09-28 — End: 2023-11-30

## 2023-09-28 MED ADMIN — methylPREDNISolone acetate (DEPO-Medrol) injection 80 mg: 80 mg | INTRAMUSCULAR | @ 19:00:00 | Stop: 2023-09-28

## 2023-09-28 NOTE — Unmapped (Signed)
 Addended by: SERENA FELLOWS E on: 09/28/2023 04:47 PM     Modules accepted: Orders

## 2023-09-29 DIAGNOSIS — M1380 Other specified arthritis, unspecified site: Principal | ICD-10-CM

## 2023-09-29 MED ORDER — GABAPENTIN 300 MG CAPSULE
ORAL_CAPSULE | Freq: Two times a day (BID) | ORAL | 0 refills | 90.00000 days | Status: CP | PRN
Start: 2023-09-29 — End: 2024-09-28

## 2023-09-30 NOTE — Unmapped (Signed)
 Specialty Medication(s): Cosentyx     James Singh has been dis-enrolled from the [facility] specialty [facility] services as a result of a pharmacy change. The patient is now filling at [facility].    Additional information provided to the patient: n/a    Tasneem Cormier S Sotirios Navarro, PharmD  [facility] Specialty Pharmacist

## 2023-11-30 ENCOUNTER — Ambulatory Visit
Admit: 2023-11-30 | Discharge: 2023-11-30 | Payer: BLUE CROSS/BLUE SHIELD | Attending: Student in an Organized Health Care Education/Training Program | Primary: Student in an Organized Health Care Education/Training Program

## 2023-11-30 ENCOUNTER — Ambulatory Visit: Admit: 2023-11-30 | Discharge: 2023-11-30 | Payer: BLUE CROSS/BLUE SHIELD

## 2023-11-30 DIAGNOSIS — M1380 Other specified arthritis, unspecified site: Principal | ICD-10-CM

## 2023-12-05 ENCOUNTER — Ambulatory Visit
Admit: 2023-12-05 | Discharge: 2023-12-06 | Payer: BLUE CROSS/BLUE SHIELD | Attending: Student in an Organized Health Care Education/Training Program | Primary: Student in an Organized Health Care Education/Training Program

## 2023-12-05 DIAGNOSIS — M1380 Other specified arthritis, unspecified site: Principal | ICD-10-CM

## 2023-12-05 DIAGNOSIS — G8929 Other chronic pain: Principal | ICD-10-CM

## 2023-12-05 DIAGNOSIS — D849 Immunodeficiency, unspecified: Principal | ICD-10-CM

## 2023-12-05 DIAGNOSIS — M25552 Pain in left hip: Principal | ICD-10-CM

## 2023-12-19 ENCOUNTER — Encounter
Admit: 2023-12-19 | Discharge: 2023-12-19 | Payer: BLUE CROSS/BLUE SHIELD | Attending: Hematology & Oncology | Primary: Hematology & Oncology

## 2023-12-19 DIAGNOSIS — R0989 Other specified symptoms and signs involving the circulatory and respiratory systems: Principal | ICD-10-CM

## 2023-12-19 DIAGNOSIS — R6883 Chills (without fever): Principal | ICD-10-CM

## 2023-12-19 DIAGNOSIS — J029 Acute pharyngitis, unspecified: Principal | ICD-10-CM

## 2023-12-19 DIAGNOSIS — R051 Acute cough: Principal | ICD-10-CM

## 2023-12-19 MED ORDER — OSELTAMIVIR 75 MG CAPSULE
ORAL_CAPSULE | Freq: Two times a day (BID) | ORAL | 0 refills | 5.00000 days | Status: CP
Start: 2023-12-19 — End: 2023-12-24

## 2023-12-19 MED ORDER — BENZONATATE 100 MG CAPSULE
ORAL_CAPSULE | Freq: Three times a day (TID) | ORAL | 0 refills | 7.00000 days | Status: CP | PRN
Start: 2023-12-19 — End: 2023-12-26

## 2023-12-20 DIAGNOSIS — J209 Acute bronchitis, unspecified: Principal | ICD-10-CM

## 2023-12-20 MED ORDER — AZITHROMYCIN 250 MG TABLET
ORAL_TABLET | ORAL | 0 refills | 5.00000 days | Status: CP
Start: 2023-12-20 — End: 2023-12-25

## 2023-12-27 ENCOUNTER — Emergency Department

## 2023-12-27 ENCOUNTER — Encounter: Payer: Self-pay | Admitting: Emergency Medicine

## 2023-12-27 ENCOUNTER — Other Ambulatory Visit: Payer: Self-pay

## 2023-12-27 ENCOUNTER — Emergency Department
Admission: EM | Admit: 2023-12-27 | Discharge: 2023-12-27 | Disposition: A | Discharge status: Home / Self Care | Attending: Emergency Medicine | Admitting: Emergency Medicine

## 2023-12-27 DIAGNOSIS — E876 Hypokalemia: Secondary | ICD-10-CM | POA: Insufficient documentation

## 2023-12-27 DIAGNOSIS — J069 Acute upper respiratory infection, unspecified: Secondary | ICD-10-CM | POA: Diagnosis not present

## 2023-12-27 DIAGNOSIS — R042 Hemoptysis: Secondary | ICD-10-CM

## 2023-12-27 DIAGNOSIS — R059 Cough, unspecified: Secondary | ICD-10-CM | POA: Diagnosis present

## 2023-12-27 LAB — COMPREHENSIVE METABOLIC PANEL WITH GFR
ALT: 25 U/L (ref 0–44)
AST: 25 U/L (ref 15–41)
Albumin: 3.8 g/dL (ref 3.5–5.0)
Alkaline Phosphatase: 77 U/L (ref 38–126)
Anion gap: 11 (ref 5–15)
BUN: 6 mg/dL (ref 6–20)
CO2: 25 mmol/L (ref 22–32)
Calcium: 9.2 mg/dL (ref 8.9–10.3)
Chloride: 102 mmol/L (ref 98–111)
Creatinine, Ser: 0.84 mg/dL (ref 0.61–1.24)
GFR, Estimated: >60 mL/min (ref >60)
Glucose, Bld: 113 mg/dL — ABNORMAL HIGH (ref 70–99)
Potassium: 3.2 mmol/L — ABNORMAL LOW (ref 3.5–5.1)
Sodium: 139 mmol/L (ref 135–145)
Total Bilirubin: 0.4 mg/dL (ref 0.0–1.2)
Total Protein: 6.8 g/dL (ref 6.5–8.1)

## 2023-12-27 LAB — CBC WITH DIFFERENTIAL/PLATELET
Abs Immature Granulocytes: 0.04 K/uL (ref 0.00–0.07)
Basophils Absolute: 0.1 K/uL (ref 0.0–0.1)
Basophils Relative: 1 %
Eosinophils Absolute: 0.1 K/uL (ref 0.0–0.5)
Eosinophils Relative: 2 %
HCT: 37.8 % — ABNORMAL LOW (ref 39.0–52.0)
Hemoglobin: 13 g/dL (ref 13.0–17.0)
Immature Granulocytes: 1 %
Lymphocytes Relative: 22 %
Lymphs Abs: 1.3 K/uL (ref 0.7–4.0)
MCH: 30.4 pg (ref 26.0–34.0)
MCHC: 34.4 g/dL (ref 30.0–36.0)
MCV: 88.3 fL (ref 80.0–100.0)
Monocytes Absolute: 0.4 K/uL (ref 0.1–1.0)
Monocytes Relative: 7 %
Neutro Abs: 4 K/uL (ref 1.7–7.7)
Neutrophils Relative %: 67 %
Platelets: 293 K/uL (ref 150–400)
RBC: 4.28 MIL/uL (ref 4.22–5.81)
RDW: 13.3 % (ref 11.5–15.5)
WBC: 6 K/uL (ref 4.0–10.5)
nRBC: 0 % (ref 0.0–0.2)

## 2023-12-27 LAB — D-DIMER, QUANTITATIVE: D-Dimer, Quant: 0.87 ug{FEU}/mL — ABNORMAL HIGH (ref 0.00–0.50)

## 2023-12-27 LAB — TROPONIN T, HIGH SENSITIVITY: Troponin T High Sensitivity: <15 ng/L (ref 0–19)

## 2023-12-27 LAB — RESP PANEL BY RT-PCR (RSV, FLU A&B, COVID)  RVPGX2
Influenza A by PCR: NEGATIVE
Influenza B by PCR: NEGATIVE
Resp Syncytial Virus by PCR: NEGATIVE
SARS Coronavirus 2 by RT PCR: NEGATIVE

## 2023-12-27 LAB — GROUP A STREP BY PCR: Group A Strep by PCR: NOT DETECTED

## 2023-12-27 MED ORDER — IPRATROPIUM-ALBUTEROL 0.5-2.5 (3) MG/3ML IN SOLN
3.0000 mL | Freq: Once | RESPIRATORY_TRACT | Status: AC
  Administered 2023-12-27: 3 mL via RESPIRATORY_TRACT
  Filled 2023-12-27: qty 3

## 2023-12-27 MED ORDER — IOHEXOL 350 MG/ML SOLN
75.0000 mL | Freq: Once | INTRAVENOUS | Status: AC | PRN
  Administered 2023-12-27: 75 mL via INTRAVENOUS

## 2023-12-27 NOTE — ED Notes (Signed)
 See triage note  Presents with cont'd cough and sore throat  States sxs' started about 1 week ago  Has been seen couple of times for same Afebrile on arrival

## 2023-12-27 NOTE — ED Triage Notes (Addendum)
 Patient ambulatory to triage with steady gait, without difficulty or distress noted; pt reports last few wks having sinus congestion and prod cough green sputum, sore throat; seen at [facility] on Thurs and rx antibiotics; strep test and CXR neg; went to [facility] primary wk before and covid/flu neg

## 2023-12-27 NOTE — ED Provider Notes (Signed)
 [facility] Provider Note    Event Date/Time   First MD Initiated Contact with Patient 12/27/23 [phone]     (approximate)   History   Cough   HPI  Charles Schroeder is a 33 year old male with history of RA presenting to the ER for cough with hemoptysis.  For the past 2 weeks, patient has had cough, congestion, sinus congestion, ear discomfort.  Has been on multiple rounds of antibiotics without benefit.  Today had an episode of coughing with blood-tinged sputum leading him to present to the ER.  No noted fevers.  Reviewed records from primary care doctor on 11/10, started on empiric treatment for Tamiflu, switch to azithromycin when this was negative and urgent care visit from 11/13.  Negative x-Marsheila Alejo.  Placed on cephalosporin for possible sinus infection, ear infection.  Also discharged with hide get an, nystatin, albuterol inhaler, steroids.     Physical Exam   Triage Vital Signs: ED Triage Vitals  Encounter Vitals Group     BP 12/27/23 0655 (!) 131/98     Girls Systolic BP Percentile --      Girls Diastolic BP Percentile --      Boys Systolic BP Percentile --      Boys Diastolic BP Percentile --      Pulse Rate 12/27/23 0655 92     Resp 12/27/23 0655 20     Temp 12/27/23 0655 98.2 F (36.8 C)     Temp Source 12/27/23 0655 Oral     SpO2 12/27/23 0655 98 %     Weight 12/27/23 0653 260 lb (117.9 kg)     Height 12/27/23 0653 6' 2 (1.88 m)     Head Circumference --      Peak Flow --      Pain Score 12/27/23 0653 4     Pain Loc --      Pain Education --      Exclude from Growth Chart --     Most recent vital signs: Vitals:   12/27/23 0655  BP: (!) 131/98  Pulse: 92  Resp: 20  Temp: 98.2 F (36.8 C)  SpO2: 98%     General: Awake, interactive  CV:  Good peripheral perfusion HEENT: No significant erythema or swelling of posterior oropharynx, previously noted exudates at urgent care not appreciable on exam today.  Right TM clear.  Left TM with mild  fluid behind the ear not red or bulging Resp:  Unlabored respirations, lung sounds mildly diminished, no appreciable wheezing Abd:  Nondistended.  Neuro:  Symmetric facial movement, fluid speech    ED Results / Procedures / Treatments   Labs (all labs ordered are listed, but only abnormal results are displayed) Labs Reviewed  CBC WITH DIFFERENTIAL/PLATELET - Abnormal; Notable for the following components:      Result Value   HCT 37.8 (*)    All other components within normal limits  COMPREHENSIVE METABOLIC PANEL WITH GFR - Abnormal; Notable for the following components:   Potassium 3.2 (*)    Glucose, Bld 113 (*)    All other components within normal limits  D-DIMER, QUANTITATIVE - Abnormal; Notable for the following components:   D-Dimer, Quant 0.87 (*)    All other components within normal limits  GROUP A STREP BY PCR  RESP PANEL BY RT-PCR (RSV, FLU A&B, COVID)  RVPGX2  TROPONIN T, HIGH SENSITIVITY     EKG EKG independently reviewed and interpreted by myself demonstrates:    RADIOLOGY Imaging independently  reviewed and interpreted by myself demonstrates:  CXR without focal consolidation CTA without PE  Formal Radiology Read:  CT Angio Chest PE W and/or Wo Contrast Result Date: 12/27/2023 CLINICAL DATA:  Unsteady gait. Sinus congestion and productive cough. Positive D-dimer. Evaluate for pulmonary embolism. EXAM: CT ANGIOGRAPHY CHEST WITH CONTRAST TECHNIQUE: Multidetector CT imaging of the chest was performed using the standard protocol during bolus administration of intravenous contrast. Multiplanar CT image reconstructions and MIPs were obtained to evaluate the vascular anatomy. RADIATION DOSE REDUCTION: This exam was performed according to the departmental dose-optimization program which includes automated exposure control, adjustment of the mA and/or kV according to patient size and/or use of iterative reconstruction technique. CONTRAST:  75mL OMNIPAQUE IOHEXOL 350 MG/ML  SOLN COMPARISON:  Chest x-Marin Wisner today and 07/13/2020. FINDINGS: Cardiovascular: Heart is normal size. Thoracic aorta is normal in caliber. Pulmonary arterial system is well opacified without evidence of emboli. Remaining vascular structures are unremarkable. Mediastinum/Nodes: No mediastinal or hilar adenopathy. Remaining mediastinal structures are normal. Lungs/Pleura: Lungs are adequately inflated without acute airspace consolidation or effusion. Minimal bibasilar atelectasis. Airways are normal. Upper Abdomen: No acute findings in the upper abdomen. Mild splenomegaly measuring 17.3 cm in greatest diameter. Musculoskeletal: No focal abnormality. Review of the MIP images confirms the above findings. IMPRESSION: 1. No acute cardiopulmonary disease and no evidence of pulmonary embolism. 2. Mild splenomegaly. Electronically Signed   By: Toribio Agreste M.D.   On: 12/27/2023 09:27   DG Chest 2 View Result Date: 12/27/2023 CLINICAL DATA:  Cough. EXAM: CHEST - 2 VIEW COMPARISON:  07/13/2020. FINDINGS: The heart size and mediastinal contours are within normal limits. No focal consolidation, pleural effusion, or pneumothorax. No acute osseous abnormality. IMPRESSION: No acute cardiopulmonary findings. Electronically Signed   By: Harrietta Sherry M.D.   On: 12/27/2023 08:24    PROCEDURES:  Critical Care performed: No  Procedures   MEDICATIONS ORDERED IN ED: Medications  ipratropium-albuterol (DUONEB) 0.5-2.5 (3) MG/3ML nebulizer solution 3 mL (3 mLs Nebulization Given 12/27/23 0737)  iohexol (OMNIPAQUE) 350 MG/ML injection 75 mL (75 mLs Intravenous Contrast Given 12/27/23 0849)     IMPRESSION / MDM / ASSESSMENT AND PLAN / ED COURSE  I reviewed the triage vital signs and the nursing notes.  Differential diagnosis includes, but is not limited to, viral URI with ongoing symptoms, hemoptysis possibly related to airway inflammation, development of pneumonia, low suspicion of strep pharyngitis given prior  negative testing, absence of significant swelling or exudates on exam today, exam without evidence of otitis media, consideration but overall lower suspicion for primary cardiac process or pulmonary embolism, does not PERC out in the setting of hemoptysis  Patient's presentation is most consistent with acute presentation with potential threat to life or bodily function.  33 year old male presenting to the emergency department for evaluation of cough and hemoptysis with symptoms ongoing for 2 weeks.  Stable vitals on presentation.  Clinical history seems more consistent with viral URI, but with development of hemoptysis will obtain labs including D-dimer to further evaluate.  Will trial breathing treatment.  Repeat x-Raffi Milstein here without pneumonia.  Labs with reassuring CBC, CMP with mild hypokalemia, negative troponin.  D-dimer elevated, CTA ordered fortunately without evidence of PE.  Viral swab and strep swab negative.  Patient reassessed.  Did not have significant proving with breathing treatment here.  Already on multiple symptomatic treatments.  Updated on results of workup.  He is comfortable with discharge home with continued supportive care measures.  Strict return precautions provided.  Patient  discharged in stable condition.      FINAL CLINICAL IMPRESSION(S) / ED DIAGNOSES   Final diagnoses:  Cough with hemoptysis  Upper respiratory tract infection, unspecified type     Rx / DC Orders   ED Discharge Orders     None        Note:  This document was prepared using Dragon voice recognition software and may include unintentional dictation errors.   Levander Slate, MD 12/27/23 [phone]

## 2023-12-27 NOTE — Discharge Instructions (Signed)
 You were seen in the emergency department today for evaluation of your cough with blood in your mucus.  Your testing fortunately did not show an emergency cause for this.  You can continue to take your medications as directed.  Follow-up with your primary care doctor for further evaluation.  Return to the ER for new or worsening symptoms.

## 2024-02-21 ENCOUNTER — Ambulatory Visit: Admit: 2024-02-21 | Discharge: 2024-02-21 | Payer: BLUE CROSS/BLUE SHIELD

## 2024-02-21 ENCOUNTER — Ambulatory Visit: Admit: 2024-02-21 | Discharge: 2024-02-21 | Payer: BLUE CROSS/BLUE SHIELD | Attending: Family | Primary: Family

## 2024-02-21 DIAGNOSIS — L4050 Arthropathic psoriasis, unspecified: Principal | ICD-10-CM

## 2024-02-21 DIAGNOSIS — M25552 Pain in left hip: Principal | ICD-10-CM

## 2024-02-21 DIAGNOSIS — Z5181 Encounter for therapeutic drug level monitoring: Principal | ICD-10-CM

## 2024-02-21 DIAGNOSIS — Z79631 Encounter for methotrexate monitoring: Principal | ICD-10-CM

## 2024-02-21 DIAGNOSIS — L700 Acne vulgaris: Principal | ICD-10-CM

## 2024-02-21 DIAGNOSIS — M1380 Other specified arthritis, unspecified site: Principal | ICD-10-CM

## 2024-02-21 DIAGNOSIS — Z23 Encounter for immunization: Principal | ICD-10-CM

## 2024-02-21 MED ORDER — METHOTREXATE SODIUM 2.5 MG TABLET
ORAL_TABLET | ORAL | 1 refills | 84.00000 days | Status: CP
Start: 2024-02-21 — End: 2025-02-20

## 2024-02-21 MED ORDER — FOLIC ACID 1 MG TABLET
ORAL_TABLET | Freq: Every day | ORAL | 3 refills | 90.00000 days | Status: CP
Start: 2024-02-21 — End: 2025-02-20

## 2024-03-13 DIAGNOSIS — L709 Acne, unspecified: Principal | ICD-10-CM

## 2024-03-13 MED ORDER — CLINDAMYCIN 1 % LOTION
Freq: Every morning | TOPICAL | 5 refills | 0.00000 days | Status: CP
Start: 2024-03-13 — End: ?

## 2024-03-13 MED ORDER — TRETINOIN 0.025 % TOPICAL CREAM
TOPICAL | 4 refills | 0.00000 days | Status: CP
Start: 2024-03-13 — End: ?

## 2024-03-13 MED ORDER — DOXYCYCLINE MONOHYDRATE 100 MG CAPSULE
ORAL_CAPSULE | Freq: Two times a day (BID) | ORAL | 2 refills | 30.00000 days | Status: CP
Start: 2024-03-13 — End: ?
